# Patient Record
Sex: Female | Born: 1995 | Race: Black or African American | Hispanic: No | Marital: Single | Smoking: Never smoker
Health system: Southern US, Community
[De-identification: ages and names within clinical notes are randomized; demographics above are authoritative.]

## PROBLEM LIST (undated history)

## (undated) ENCOUNTER — Inpatient Hospital Stay (HOSPITAL_COMMUNITY): Payer: Self-pay

## (undated) ENCOUNTER — Inpatient Hospital Stay (HOSPITAL_COMMUNITY): Payer: No Typology Code available for payment source | Admitting: Obstetrics and Gynecology

## (undated) ENCOUNTER — Emergency Department: Admission: EM | Payer: No Typology Code available for payment source | Source: Home / Self Care

## (undated) ENCOUNTER — Emergency Department (HOSPITAL_COMMUNITY): Admission: EM | Payer: No Typology Code available for payment source

## (undated) DIAGNOSIS — A749 Chlamydial infection, unspecified: Secondary | ICD-10-CM

## (undated) HISTORY — PX: NO PAST SURGERIES: SHX2092

---
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
## 2013-02-19 ENCOUNTER — Encounter (HOSPITAL_BASED_OUTPATIENT_CLINIC_OR_DEPARTMENT_OTHER): Payer: Self-pay

## 2013-02-19 ENCOUNTER — Emergency Department (HOSPITAL_BASED_OUTPATIENT_CLINIC_OR_DEPARTMENT_OTHER)
Admission: EM | Admit: 2013-02-19 | Discharge: 2013-02-19 | Disposition: A | Discharge status: Home / Self Care | Payer: Self-pay | Attending: Emergency Medicine | Admitting: Emergency Medicine

## 2013-02-19 DIAGNOSIS — S0990XA Unspecified injury of head, initial encounter: Secondary | ICD-10-CM | POA: Insufficient documentation

## 2013-02-19 DIAGNOSIS — Y9351 Activity, roller skating (inline) and skateboarding: Secondary | ICD-10-CM | POA: Insufficient documentation

## 2013-02-19 DIAGNOSIS — Y92009 Unspecified place in unspecified non-institutional (private) residence as the place of occurrence of the external cause: Secondary | ICD-10-CM | POA: Insufficient documentation

## 2013-02-19 DIAGNOSIS — IMO0002 Reserved for concepts with insufficient information to code with codable children: Secondary | ICD-10-CM | POA: Insufficient documentation

## 2013-02-19 MED ORDER — HYDROCODONE-ACETAMINOPHEN 5-325 MG PO TABS: 1.0000 | ORAL_TABLET | Freq: Four times a day (QID) | ORAL | Status: DC | PRN

## 2013-02-19 MED ORDER — HYDROCODONE-ACETAMINOPHEN 5-325 MG PO TABS
2.0000 | ORAL_TABLET | Freq: Once | ORAL | Status: AC
  Administered 2013-02-19: 2 via ORAL
  Filled 2013-02-19: qty 2

## 2013-02-19 NOTE — ED Notes (Signed)
Patient reports that she was playing and fell hitting head on stone wall. No loc. Complains of feeling dizzy and tired. Alert and oiented

## 2013-02-19 NOTE — ED Notes (Signed)
MD at bedside. 

## 2013-02-19 NOTE — ED Provider Notes (Signed)
TIME SEEN: 9:30 PM  CHIEF COMPLAINT: Head injury  HPI: Patient is a 17 year old female with no significant past medical history presents the emergency department after she hit her head on a stone wall at her house approximately 2 hours ago. Patient reports that she was playing with her sister and turgor head quickly and hit her left side of her head against a wall. There was no loss of consciousness. She states she's had some dizziness that has resolved. No numbness, tingling or focal weakness. No vision or hearing changes.  ROS: See HPI Constitutional: no fever  Eyes: no drainage  ENT: no runny nose   Cardiovascular:  no chest pain  Resp: no SOB  GI: no vomiting GU: no dysuria Integumentary: no rash  Allergy: no hives  Musculoskeletal: no leg swelling  Neurological: no slurred speech ROS otherwise negative  PAST MEDICAL HISTORY/PAST SURGICAL HISTORY:  History reviewed. No pertinent past medical history.  MEDICATIONS:  Prior to Admission medications   Not on File    ALLERGIES:  No Known Allergies  SOCIAL HISTORY:  History  Substance Use Topics  . Smoking status: Never Smoker   . Smokeless tobacco: Not on file  . Alcohol Use: No    FAMILY HISTORY: Family History  Problem Relation Age of Onset  . Cancer Other   . Asthma Other     EXAM: BP 120/74  Pulse 74  Temp(Src) 98.3 F (36.8 C) (Oral)  Resp 18  SpO2 100%  LMP 02/05/2013 CONSTITUTIONAL: Alert and oriented and responds appropriately to questions. Well-appearing; well-nourished; GCS 15 HEAD: Normocephalic; atraumatic EYES: Conjunctivae clear, PERRL, EOMI ENT: normal nose; no rhinorrhea; moist mucous membranes; pharynx without lesions noted; no dental injury; no hemotypanum; no septal hematoma, mild tennis to palpation over the left side of her face and scalp with no bony deformity, swelling or ecchymosis NECK: Supple, no meningismus, no LAD; no midline spinal tenderness, step-off or deformity CARD: RRR; S1  and S2 appreciated; no murmurs, no clicks, no rubs, no gallops RESP: Normal chest excursion without splinting or tachypnea; breath sounds clear and equal bilaterally; no wheezes, no rhonchi, no rales; chest wall stable, nontender to palpation ABD/GI: Normal bowel sounds; non-distended; soft, non-tender, no rebound, no guarding PELVIS:  stable, nontender to palpation BACK:  The back appears normal and is non-tender to palpation, there is no CVA tenderness; no midline spinal tenderness, step-off or deformity EXT: Normal ROM in all joints; non-tender to palpation; no edema; normal capillary refill; no cyanosis    SKIN: Normal color for age and race; warm NEURO: Moves all extremities equally strength 5/5 in all 4 extremities, cranial nerves II through XII grossly intact, sensation to light touch intact diffusely, normal gait, no dysmetria finger-nose testing PSYCH: The patient's mood and manner are appropriate. Grooming and personal hygiene are appropriate.  MEDICAL DECISION MAKING: Patient with head injury who is neurologically intact. Discussed with patient that the likelihood of intracranial hemorrhage or fracture is very low. She agrees that she does not want a CT scan of her head at this time. She would like pain medication and discharge home. Discussed at length return precautions. Patient verbalizes understanding is comfortable with this plan.     Layla Maw Ward, DO 02/19/13 2156

## 2013-02-20 NOTE — ED Notes (Signed)
D/c home with ride- ice pack given for home use- rx x 1 for hydrocodone

## 2013-05-29 ENCOUNTER — Encounter (HOSPITAL_COMMUNITY): Payer: Self-pay | Admitting: Emergency Medicine

## 2013-05-29 ENCOUNTER — Emergency Department (INDEPENDENT_AMBULATORY_CARE_PROVIDER_SITE_OTHER)
Admission: EM | Admit: 2013-05-29 | Discharge: 2013-05-29 | Disposition: A | Discharge status: Home / Self Care | Payer: Self-pay | Source: Home / Self Care | Attending: Family Medicine | Admitting: Family Medicine

## 2013-05-29 DIAGNOSIS — L03019 Cellulitis of unspecified finger: Secondary | ICD-10-CM

## 2013-05-29 DIAGNOSIS — IMO0001 Reserved for inherently not codable concepts without codable children: Secondary | ICD-10-CM

## 2013-05-29 MED ORDER — MUPIROCIN CALCIUM 2 % EX CREA: 1.0000 "application " | TOPICAL_CREAM | Freq: Two times a day (BID) | CUTANEOUS | Status: DC

## 2013-05-29 NOTE — ED Notes (Signed)
C/o left middle finger swelling Denies injury but states that she did have a hang nail which could have got infected.

## 2013-05-29 NOTE — ED Provider Notes (Signed)
CSN: [id number]     Arrival date & time 05/29/13  1409 History   First MD Initiated Contact with Patient 05/29/13 1428     Chief Complaint  Patient presents with  . Hand Pain   (Consider location/radiation/quality/duration/timing/severity/associated sxs/prior Treatment) Patient is a 17 y.o. female presenting with hand pain. The history is provided by the patient.  Hand Pain This is a new problem. The current episode started more than 2 days ago. The problem has been gradually worsening.    History reviewed. No pertinent past medical history. History reviewed. No pertinent past surgical history. Family History  Problem Relation Age of Onset  . Cancer Other   . Asthma Other    History  Substance Use Topics  . Smoking status: Never Smoker   . Smokeless tobacco: Not on file  . Alcohol Use: No   OB History   Grav Para Term Preterm Abortions TAB SAB Ect Mult Living                 Review of Systems  Constitutional: Negative.   Musculoskeletal: Negative for joint swelling.  Skin: Positive for wound.    Allergies  Review of patient's allergies indicates no known allergies.  Home Medications   Current Outpatient Rx  Name  Route  Sig  Dispense  Refill  . HYDROcodone-acetaminophen (NORCO/VICODIN) 5-325 MG per tablet   Oral   Take 1 tablet by mouth every 6 (six) hours as needed for pain.   10 tablet   0   . mupirocin cream (BACTROBAN) 2 %   Topical   Apply 1 application topically 2 (two) times daily.   30 g   0    BP 128/75  Pulse 87  Temp(Src) 98.5 F (36.9 C) (Oral)  Resp 16  SpO2 97%  LMP 03/22/2013 Physical Exam  Nursing note and vitals reviewed. Constitutional: She is oriented to person, place, and time. She appears well-developed and well-nourished.  Musculoskeletal: She exhibits tenderness.  Paronychia of lmf, purulent fluid expressed, cultured.  Neurological: She is alert and oriented to person, place, and time.  Skin: Skin is warm and dry.    ED  Course  Procedures (including critical care time) Labs Review Labs Reviewed  WOUND CULTURE   Imaging Review No results found.  EKG Interpretation    Date/Time:    Ventricular Rate:    PR Interval:    QRS Duration:   QT Interval:    QTC Calculation:   R Axis:     Text Interpretation:              MDM     Linna Hoff, MD 05/29/13 1443

## 2013-05-31 LAB — POCT URINALYSIS DIP (DEVICE)
Glucose, UA: NEGATIVE mg/dL
Hgb urine dipstick: NEGATIVE
Nitrite: POSITIVE — AB
Urobilinogen, UA: 0.2 mg/dL (ref 0.0–1.0)
pH: 7 (ref 5.0–8.0)

## 2013-06-01 LAB — WOUND CULTURE

## 2013-06-30 ENCOUNTER — Encounter (HOSPITAL_COMMUNITY): Payer: Self-pay | Admitting: Emergency Medicine

## 2013-06-30 ENCOUNTER — Emergency Department (HOSPITAL_COMMUNITY)
Admission: EM | Admit: 2013-06-30 | Discharge: 2013-06-30 | Discharge status: Against Medical Advice | Payer: Self-pay | Attending: Emergency Medicine | Admitting: Emergency Medicine

## 2013-06-30 DIAGNOSIS — N946 Dysmenorrhea, unspecified: Secondary | ICD-10-CM | POA: Insufficient documentation

## 2013-06-30 LAB — CBC WITH DIFFERENTIAL/PLATELET
BASOS PCT: 0 % (ref 0–1)
Basophils Absolute: 0 10*3/uL (ref 0.0–0.1)
Eosinophils Absolute: 0 10*3/uL (ref 0.0–0.7)
Eosinophils Relative: 0 % (ref 0–5)
HEMATOCRIT: 39.6 % (ref 36.0–46.0)
HEMOGLOBIN: 13.5 g/dL (ref 12.0–15.0)
LYMPHS ABS: 2 10*3/uL (ref 0.7–4.0)
LYMPHS PCT: 31 % (ref 12–46)
MCH: 30.8 pg (ref 26.0–34.0)
MCHC: 34.1 g/dL (ref 30.0–36.0)
MCV: 90.2 fL (ref 78.0–100.0)
MONO ABS: 0.3 10*3/uL (ref 0.1–1.0)
MONOS PCT: 5 % (ref 3–12)
NEUTROS ABS: 4 10*3/uL (ref 1.7–7.7)
NEUTROS PCT: 64 % (ref 43–77)
Platelets: 254 10*3/uL (ref 150–400)
RBC: 4.39 MIL/uL (ref 3.87–5.11)
RDW: 12 % (ref 11.5–15.5)
WBC: 6.3 10*3/uL (ref 4.0–10.5)

## 2013-06-30 LAB — COMPREHENSIVE METABOLIC PANEL
ALBUMIN: 4.1 g/dL (ref 3.5–5.2)
ALK PHOS: 62 U/L (ref 39–117)
ALT: 8 U/L (ref 0–35)
AST: 19 U/L (ref 0–37)
BILIRUBIN TOTAL: 0.2 mg/dL — AB (ref 0.3–1.2)
BUN: 11 mg/dL (ref 6–23)
CHLORIDE: 102 meq/L (ref 96–112)
CO2: 25 meq/L (ref 19–32)
Calcium: 9.2 mg/dL (ref 8.4–10.5)
Creatinine, Ser: 0.66 mg/dL (ref 0.50–1.10)
GFR calc Af Amer: >90 mL/min (ref >90)
GLUCOSE: 112 mg/dL — AB (ref 70–99)
POTASSIUM: 4 meq/L (ref 3.7–5.3)
Sodium: 140 mEq/L (ref 137–147)
Total Protein: 7.7 g/dL (ref 6.0–8.3)

## 2013-06-30 LAB — URINE MICROSCOPIC-ADD ON

## 2013-06-30 LAB — URINALYSIS, ROUTINE W REFLEX MICROSCOPIC
Bilirubin Urine: NEGATIVE
GLUCOSE, UA: NEGATIVE mg/dL
Ketones, ur: NEGATIVE mg/dL
NITRITE: POSITIVE — AB
PH: 6.5 (ref 5.0–8.0)
Protein, ur: 30 mg/dL — AB
SPECIFIC GRAVITY, URINE: 1.028 (ref 1.005–1.030)
Urobilinogen, UA: 1 mg/dL (ref 0.0–1.0)

## 2013-06-30 LAB — POCT PREGNANCY, URINE: PREG TEST UR: NEGATIVE

## 2013-06-30 NOTE — ED Notes (Signed)
Pt. reports low abdominal menstrual cramping onset yesterday , denies nausea or vomitting , no fever or diarrhea.

## 2013-07-02 ENCOUNTER — Telehealth (HOSPITAL_COMMUNITY): Payer: Self-pay | Admitting: Emergency Medicine

## 2013-07-02 LAB — URINE CULTURE: Colony Count: >=100000

## 2013-07-02 NOTE — ED Notes (Signed)
Post ED Visit - Positive Culture Follow-up: Successful Patient Follow-Up  Culture assessed and recommendations reviewed by: []  Wes Dulaney, Pharm.D., BCPS []  Celedonio MiyamotoJeremy Frens, [address] BCPS []  Georgina PillionElizabeth Martin, Pharm.D., BCPS []  RowlettMinh Pham, [address]Pharm[address]D., BCPS, AAHIVP []  Estella HuskMichelle Turner, Pharm.D., BCPS, AAHIVP [x]  Harland GermanAndrew Meyer, Pharm.D., BCPS  Positive urine culture  [x]  Patient discharged without antimicrobial prescription and treatment is now indicated []  Organism is resistant to prescribed ED discharge antimicrobial []  Patient with positive blood cultures  Changes discussed with ED provider: Trixie DredgeEmily West PA-C New antibiotic prescription: Cipro 250 mg PO BID x 3 days    Zeb ComfortHolland, Olamide Lahaie 07/02/2013, 1:06 PM

## 2013-07-02 NOTE — Progress Notes (Signed)
ED Antimicrobial Stewardship Positive Culture Follow Up   Anita Johnson is an 18 y.o. female who [facility] with a chief complaint of  Chief Complaint  Patient presents with  . Abdominal Cramping    Recent Results (from the past 720 hour(s))  URINE CULTURE     Status: None   Collection Time    06/30/13  8:02 PM      Result Value Range Status   Specimen Description URINE, CLEAN CATCH   Final   Special Requests NONE   Final   Culture  Setup Time     Final   Value: 06/30/2013 21:32     [facility]formed at [facility]   Colony Count     Final   Value: >=100,000 COLONIES/ML     [facility]   Culture     Final   Value: ESCHERICHIA COLI     [facility] at [facility]   Report Status 07/02/2013 FINAL   Final   Organism ID, Bacteria ESCHERICHIA COLI   Final    [x]  Patient discharged originally without antimicrobial agent and treatment is now indicated  New antibiotic prescription: Cipro 250mg  po q12h x 3days  ED Provider: [facility]redgemily West [facility]  Harland GermanAndrew Raeley Gilmore, Pharm D 07/02/2013 11:56 AM  Clinical Pharmacist  Phone# [phone]

## 2013-07-06 ENCOUNTER — Emergency Department (INDEPENDENT_AMBULATORY_CARE_PROVIDER_SITE_OTHER)
Admission: EM | Admit: 2013-07-06 | Discharge: 2013-07-06 | Disposition: A | Discharge status: Home / Self Care | Payer: Self-pay | Source: Home / Self Care | Attending: Family Medicine | Admitting: Family Medicine

## 2013-07-06 ENCOUNTER — Encounter (HOSPITAL_COMMUNITY): Payer: Self-pay | Admitting: Emergency Medicine

## 2013-07-06 DIAGNOSIS — S239XXA Sprain of unspecified parts of thorax, initial encounter: Secondary | ICD-10-CM

## 2013-07-06 MED ORDER — IBUPROFEN 800 MG PO TABS: 800.0000 mg | ORAL_TABLET | Freq: Three times a day (TID) | ORAL | Status: DC

## 2013-07-06 MED ORDER — CYCLOBENZAPRINE HCL 5 MG PO TABS: 5.0000 mg | ORAL_TABLET | Freq: Three times a day (TID) | ORAL | Status: DC | PRN

## 2013-07-06 NOTE — ED Notes (Signed)
Pt  Reports       Symptoms      Of         Low  Back  Pain      X  2  Days        The pt       denys  Any  specefic  Injury  She  Ambulates  With a  Steady    Fluid  Gait                denys  Any  Urinary       Symptoms           Sitting  Upright  On       Exam   Table  In no  disteress

## 2013-07-06 NOTE — ED Provider Notes (Signed)
CSN: [id number]     Arrival date & time 07/06/13  1820 History   First MD Initiated Contact with Patient 07/06/13 1840     Chief Complaint  Patient presents with  . Back Pain   (Consider location/radiation/quality/duration/timing/severity/associated sxs/prior Treatment) Patient is a 18 y.o. female presenting with back pain. The history is provided by the patient and a parent.  Back Pain Location:  Thoracic spine Quality:  Shooting Radiates to:  Does not radiate Pain severity:  Mild Onset quality:  Gradual Duration:  2 days Progression:  Waxing and waning Chronicity:  New Context: lifting heavy objects   Associated symptoms: no abdominal pain, no chest pain, no fever and no numbness     History reviewed. No pertinent past medical history. History reviewed. No pertinent past surgical history. Family History  Problem Relation Age of Onset  . Cancer Other   . Asthma Other    History  Substance Use Topics  . Smoking status: Never Smoker   . Smokeless tobacco: Not on file  . Alcohol Use: No   OB History   Grav Para Term Preterm Abortions TAB SAB Ect Mult Living                 Review of Systems  Constitutional: Negative.  Negative for fever.  Respiratory: Negative for chest tightness.   Cardiovascular: Negative for chest pain.  Gastrointestinal: Negative.  Negative for abdominal pain.  Musculoskeletal: Positive for back pain. Negative for gait problem.  Skin: Negative.   Neurological: Negative for numbness.    Allergies  Review of patient's allergies indicates no known allergies.  Home Medications   Current Outpatient Rx  Name  Route  Sig  Dispense  Refill  . cyclobenzaprine (FLEXERIL) 5 MG tablet   Oral   Take 1 tablet (5 mg total) by mouth 3 (three) times daily as needed for muscle spasms.   30 tablet   0   . HYDROcodone-acetaminophen (NORCO/VICODIN) 5-325 MG per tablet   Oral   Take 1 tablet by mouth every 6 (six) hours as needed for pain.   10 tablet  0   . ibuprofen (ADVIL,MOTRIN) 800 MG tablet   Oral   Take 1 tablet (800 mg total) by mouth 3 (three) times daily.   30 tablet   0   . mupirocin cream (BACTROBAN) 2 %   Topical   Apply 1 application topically 2 (two) times daily.   30 g   0    BP 120/73  Pulse 64  Temp(Src) 97.9 F (36.6 C) (Oral)  Resp 18  SpO2 100%  LMP 07/03/2013 Physical Exam  Nursing note and vitals reviewed. Constitutional: She is oriented to person, place, and time. She appears well-developed and well-nourished. No distress.  Neck: Normal range of motion. Neck supple.  Cardiovascular: Normal heart sounds and intact distal pulses.   Pulmonary/Chest: Effort normal and breath sounds normal. She exhibits no tenderness.  Abdominal: Soft. Bowel sounds are normal. There is no tenderness.  Musculoskeletal: She exhibits tenderness.       Back:  Neurological: She is alert and oriented to person, place, and time.  Skin: Skin is warm and dry.    ED Course  Procedures (including critical care time) Labs Review Labs Reviewed - No data to display Imaging Review No results found.  EKG Interpretation    Date/Time:    Ventricular Rate:    PR Interval:    QRS Duration:   QT Interval:    QTC Calculation:  R Axis:     Text Interpretation:              MDM      Linna HoffJames D Jenniffer Vessels, MD 07/06/13 2102

## 2013-07-19 ENCOUNTER — Encounter (HOSPITAL_COMMUNITY): Payer: Self-pay | Admitting: Emergency Medicine

## 2013-07-19 ENCOUNTER — Emergency Department (HOSPITAL_COMMUNITY)
Admission: EM | Admit: 2013-07-19 | Discharge: 2013-07-19 | Discharge status: Against Medical Advice | Payer: Self-pay | Attending: Emergency Medicine | Admitting: Emergency Medicine

## 2013-07-19 DIAGNOSIS — R059 Cough, unspecified: Secondary | ICD-10-CM | POA: Insufficient documentation

## 2013-07-19 DIAGNOSIS — R51 Headache: Secondary | ICD-10-CM | POA: Insufficient documentation

## 2013-07-19 DIAGNOSIS — R05 Cough: Secondary | ICD-10-CM | POA: Insufficient documentation

## 2013-07-19 NOTE — ED Notes (Signed)
C/o frontal headache, dizziness, and non-productive cough since yesterday.

## 2013-07-19 NOTE — ED Notes (Signed)
No answer when called 

## 2013-07-19 NOTE — ED Notes (Signed)
Patient called and no answer. 

## 2014-03-16 ENCOUNTER — Emergency Department (HOSPITAL_COMMUNITY)
Admission: EM | Admit: 2014-03-16 | Discharge: 2014-03-17 | Disposition: A | Discharge status: Home / Self Care | Payer: No Typology Code available for payment source | Attending: Emergency Medicine | Admitting: Emergency Medicine

## 2014-03-16 ENCOUNTER — Encounter (HOSPITAL_COMMUNITY): Payer: Self-pay | Admitting: Emergency Medicine

## 2014-03-16 DIAGNOSIS — Y9389 Activity, other specified: Secondary | ICD-10-CM | POA: Insufficient documentation

## 2014-03-16 DIAGNOSIS — S99919A Unspecified injury of unspecified ankle, initial encounter: Principal | ICD-10-CM

## 2014-03-16 DIAGNOSIS — Y92410 Unspecified street and highway as the place of occurrence of the external cause: Secondary | ICD-10-CM | POA: Insufficient documentation

## 2014-03-16 DIAGNOSIS — S199XXA Unspecified injury of neck, initial encounter: Secondary | ICD-10-CM

## 2014-03-16 DIAGNOSIS — S8990XA Unspecified injury of unspecified lower leg, initial encounter: Secondary | ICD-10-CM | POA: Insufficient documentation

## 2014-03-16 DIAGNOSIS — M25561 Pain in right knee: Secondary | ICD-10-CM

## 2014-03-16 DIAGNOSIS — R519 Headache, unspecified: Secondary | ICD-10-CM

## 2014-03-16 DIAGNOSIS — R51 Headache: Secondary | ICD-10-CM

## 2014-03-16 DIAGNOSIS — S0993XA Unspecified injury of face, initial encounter: Secondary | ICD-10-CM | POA: Insufficient documentation

## 2014-03-16 DIAGNOSIS — S99929A Unspecified injury of unspecified foot, initial encounter: Principal | ICD-10-CM
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
## 2014-03-17 ENCOUNTER — Emergency Department (HOSPITAL_COMMUNITY): Payer: No Typology Code available for payment source

## 2014-03-17 MED ORDER — OXYCODONE-ACETAMINOPHEN 5-325 MG PO TABS
1.0000 | ORAL_TABLET | Freq: Four times a day (QID) | ORAL | Status: DC | PRN
Start: 2014-03-17 — End: 2014-05-23

## 2014-03-17 MED ORDER — OXYCODONE-ACETAMINOPHEN 5-325 MG PO TABS
2.0000 | ORAL_TABLET | Freq: Once | ORAL | Status: AC
  Administered 2014-03-17: 1 via ORAL
  Filled 2014-03-17: qty 2

## 2014-03-17 MED ORDER — NAPROXEN 500 MG PO TABS: 500.0000 mg | ORAL_TABLET | Freq: Two times a day (BID) | ORAL | Status: DC

## 2014-03-17 NOTE — Discharge Instructions (Signed)
Your x-ray is negative for fracture or dislocation of your right knee. Recommend you keep your knee in a knee sleeve for stability and comfort. Take naproxen as prescribed for pain. You may take Percocet as needed for severe pain control. Apply ice to your knee 3-4 times per day. Use crutches as needed when walking. Follow up with orthopedics if symptoms persist despite recommended treatment after 1 to 2 weeks.  Arthralgia Your caregiver has diagnosed you as suffering from an arthralgia. Arthralgia means there is pain in a joint. This can come from many reasons including:  Bruising the joint which causes soreness (inflammation) in the joint.  Wear and tear on the joints which occur as we grow older (osteoarthritis).  Overusing the joint.  Various forms of arthritis.  Infections of the joint. Regardless of the cause of pain in your joint, most of these different pains respond to anti-inflammatory drugs and rest. The exception to this is when a joint is infected, and these cases are treated with antibiotics, if it is a bacterial infection. HOME CARE INSTRUCTIONS   Rest the injured area for as long as directed by your caregiver. Then slowly start using the joint as directed by your caregiver and as the pain allows. Crutches as directed may be useful if the ankles, knees or hips are involved. If the knee was splinted or casted, continue use and care as directed. If an stretchy or elastic wrapping bandage has been applied today, it should be removed and re-applied every 3 to 4 hours. It should not be applied tightly, but firmly enough to keep swelling down. Watch toes and feet for swelling, bluish discoloration, coldness, numbness or excessive pain. If any of these problems (symptoms) occur, remove the ace bandage and re-apply more loosely. If these symptoms persist, contact your caregiver or return to this location.  For the first 24 hours, keep the injured extremity elevated on pillows while lying  down.  Apply ice for 15-20 minutes to the sore joint every couple hours while awake for the first half day. Then 03-04 times per day for the first 48 hours. Put the ice in a plastic bag and place a towel between the bag of ice and your skin.  Wear any splinting, casting, elastic bandage applications, or slings as instructed.  Only take over-the-counter or prescription medicines for pain, discomfort, or fever as directed by your caregiver. Do not use aspirin immediately after the injury unless instructed by your physician. Aspirin can cause increased bleeding and bruising of the tissues.  If you were given crutches, continue to use them as instructed and do not resume weight bearing on the sore joint until instructed. Persistent pain and inability to use the sore joint as directed for more than 2 to 3 days are warning signs indicating that you should see a caregiver for a follow-up visit as soon as possible. Initially, a hairline fracture (break in bone) may not be evident on X-rays. Persistent pain and swelling indicate that further evaluation, non-weight bearing or use of the joint (use of crutches or slings as instructed), or further X-rays are indicated. X-rays may sometimes not show a small fracture until a week or 10 days later. Make a follow-up appointment with your own caregiver or one to whom we have referred you. A radiologist (specialist in reading X-rays) may read your X-rays. Make sure you know how you are to obtain your X-ray results. Do not assume everything is normal if you do not hear from [place]. SEEK  MEDICAL CARE IF: Bruising, swelling, or pain increases. SEEK IMMEDIATE MEDICAL CARE IF:   Your fingers or toes are numb or blue.  The pain is not responding to medications and continues to stay the same or get worse.  The pain in your joint becomes severe.  You develop a fever over 102 F (38.9 C).  It becomes impossible to move or use the joint. MAKE SURE YOU:   Understand these  instructions.  Will watch your condition.  Will get help right away if you are not doing well or get worse. Document Released: 06/08/2005 Document Revised: 08/31/2011 Document Reviewed: 01/25/2008 [facility] Patient Information 2015 [place], [place]. This information is not intended to replace advice given to you by your health care provider. Make sure you discuss any questions you have with your health care provider. RICE: Routine Care for Injuries The routine care of many injuries includes Rest, Ice, Compression, and Elevation (RICE). HOME CARE INSTRUCTIONS  Rest is needed to allow your body to heal. Routine activities can usually be resumed when comfortable. Injured tendons and bones can take up to 6 weeks to heal. Tendons are the cord-like structures that attach muscle to bone.  Ice following an injury helps keep the swelling down and reduces pain.  Put ice in a plastic bag.  Place a towel between your skin and the bag.  Leave the ice on for 15-20 minutes, 3-4 times a day, or as directed by your health care provider. Do this while awake, for the first 24 to 48 hours. After that, continue as directed by your caregiver.  Compression helps keep swelling down. It also gives support and helps with discomfort. If an elastic bandage has been applied, it should be removed and reapplied every 3 to 4 hours. It should not be applied tightly, but firmly enough to keep swelling down. Watch fingers or toes for swelling, bluish discoloration, coldness, numbness, or excessive pain. If any of these problems occur, remove the bandage and reapply loosely. Contact your caregiver if these problems continue.  Elevation helps reduce swelling and decreases pain. With extremities, such as the arms, hands, legs, and feet, the injured area should be placed near or above the level of the heart, if possible. SEEK IMMEDIATE MEDICAL CARE IF:  You have persistent pain and swelling.  You develop redness, numbness, or  unexpected weakness.  Your symptoms are getting worse rather than improving after several days. These symptoms may indicate that further evaluation or further X-rays are needed. Sometimes, X-rays may not show a small broken bone (fracture) until 1 week or 10 days later. Make a follow-up appointment with your caregiver. Ask when your X-ray results will be ready. Make sure you get your X-ray results. Document Released: 09/20/2000 Document Revised: 06/13/2013 Document Reviewed: 11/07/2010 [facility] Patient Information 2015 [place], [place]. This information is not intended to replace advice given to you by your health care provider. Make sure you discuss any questions you have with your health care provider.

## 2014-03-17 NOTE — ED Provider Notes (Signed)
Medical screening examination/treatment/procedure(s) were performed by non-physician practitioner and as supervising physician I was immediately available for consultation/collaboration.   EKG Interpretation None       Brendt Dible M Deaven Urwin, MD 03/17/14 0243 

## 2014-04-24 ENCOUNTER — Emergency Department (HOSPITAL_COMMUNITY)
Admission: EM | Admit: 2014-04-24 | Discharge: 2014-04-24 | Disposition: A | Discharge status: Home / Self Care | Payer: Medicaid Other | Attending: Emergency Medicine | Admitting: Emergency Medicine

## 2014-04-24 ENCOUNTER — Encounter (HOSPITAL_COMMUNITY): Payer: Self-pay | Admitting: Physical Medicine and Rehabilitation

## 2014-04-24 DIAGNOSIS — O2391 Unspecified genitourinary tract infection in pregnancy, first trimester: Secondary | ICD-10-CM | POA: Diagnosis not present

## 2014-04-24 DIAGNOSIS — N760 Acute vaginitis: Secondary | ICD-10-CM

## 2014-04-24 DIAGNOSIS — R1030 Lower abdominal pain, unspecified: Secondary | ICD-10-CM | POA: Insufficient documentation

## 2014-04-24 DIAGNOSIS — B9689 Other specified bacterial agents as the cause of diseases classified elsewhere: Secondary | ICD-10-CM

## 2014-04-24 DIAGNOSIS — O99891 Other specified diseases and conditions complicating pregnancy: Secondary | ICD-10-CM

## 2014-04-24 DIAGNOSIS — O9989 Other specified diseases and conditions complicating pregnancy, childbirth and the puerperium: Secondary | ICD-10-CM | POA: Insufficient documentation

## 2014-04-24 DIAGNOSIS — R1032 Left lower quadrant pain: Secondary | ICD-10-CM

## 2014-04-24 DIAGNOSIS — Z3A01 Less than 8 weeks gestation of pregnancy: Secondary | ICD-10-CM | POA: Insufficient documentation

## 2014-04-24 DIAGNOSIS — Z3401 Encounter for supervision of normal first pregnancy, first trimester: Secondary | ICD-10-CM

## 2014-04-24 DIAGNOSIS — R8271 Bacteriuria: Secondary | ICD-10-CM

## 2014-04-24 DIAGNOSIS — R1031 Right lower quadrant pain: Secondary | ICD-10-CM

## 2014-04-24 LAB — WET PREP, GENITAL
Trich, Wet Prep: NONE SEEN
Yeast Wet Prep HPF POC: NONE SEEN

## 2014-04-24 LAB — URINALYSIS, ROUTINE W REFLEX MICROSCOPIC
BILIRUBIN URINE: NEGATIVE
Glucose, UA: NEGATIVE mg/dL
HGB URINE DIPSTICK: NEGATIVE
Ketones, ur: NEGATIVE mg/dL
Nitrite: NEGATIVE
PH: 8 (ref 5.0–8.0)
Protein, ur: NEGATIVE mg/dL
SPECIFIC GRAVITY, URINE: 1.021 (ref 1.005–1.030)
Urobilinogen, UA: 0.2 mg/dL (ref 0.0–1.0)

## 2014-04-24 LAB — POC URINE PREG, ED: Preg Test, Ur: POSITIVE — AB

## 2014-04-24 LAB — HIV ANTIBODY (ROUTINE TESTING W REFLEX): HIV 1&2 Ab, 4th Generation: NONREACTIVE

## 2014-04-24 LAB — URINE MICROSCOPIC-ADD ON

## 2014-04-24 LAB — RPR

## 2014-04-24 MED ORDER — METRONIDAZOLE 500 MG PO TABS: 500.0000 mg | ORAL_TABLET | Freq: Two times a day (BID) | ORAL | Status: DC

## 2014-04-24 MED ORDER — NITROFURANTOIN MONOHYD MACRO 100 MG PO CAPS: 100.0000 mg | ORAL_CAPSULE | Freq: Two times a day (BID) | ORAL | Status: DC

## 2014-04-24 NOTE — Discharge Instructions (Signed)
°Abdominal Pain During Pregnancy °Abdominal pain is common in pregnancy. Most of the time, it does not cause harm. There are many causes of abdominal pain. Some causes are more serious than others. Some of the causes of abdominal pain in pregnancy are easily diagnosed. Occasionally, the diagnosis takes time to understand. Other times, the cause is not determined. Abdominal pain can be a sign that something is very wrong with the pregnancy, or the pain may have nothing to do with the pregnancy at all. For this reason, always tell your health care provider if you have any abdominal discomfort. °HOME CARE INSTRUCTIONS  °Monitor your abdominal pain for any changes. The following actions may help to alleviate any discomfort you are experiencing: °· Do not have sexual intercourse or put anything in your vagina until your symptoms go away completely. °· Get plenty of rest until your pain improves. °· Drink clear fluids if you feel nauseous. Avoid solid food as long as you are uncomfortable or nauseous. °· Only take over-the-counter or prescription medicine as directed by your health care provider. °· Keep all follow-up appointments with your health care provider. °SEEK IMMEDIATE MEDICAL CARE IF: °· You are bleeding, leaking fluid, or passing tissue from the vagina. °· You have increasing pain or cramping. °· You have persistent vomiting. °· You have painful or bloody urination. °· You have a fever. °· You notice a decrease in your baby's movements. °· You have extreme weakness or feel faint. °· You have shortness of breath, with or without abdominal pain. °· You develop a severe headache with abdominal pain. °· You have abnormal vaginal discharge with abdominal pain. °· You have persistent diarrhea. °· You have abdominal pain that continues even after rest, or gets worse. °MAKE SURE YOU:  °· Understand these instructions. °· Will watch your condition. °· Will get help right away if you are not doing well or get  worse. °Document Released: 06/08/2005 Document Revised: 03/29/2013 Document Reviewed: 01/05/2013 °Exit[employer]® Patient Information ©2015 [employer]. This information is not intended to replace advice given to you by your health care provider. Make sure you discuss any questions you have with your health care provider. °Bacterial Vaginosis °Bacterial vaginosis is a vaginal infection that occurs when the normal balance of bacteria in the vagina is disrupted. It results from an overgrowth of certain bacteria. This is the most common vaginal infection in women of childbearing age. Treatment is important to prevent complications, especially in pregnant women, as it can cause a premature delivery. °CAUSES  °Bacterial vaginosis is caused by an increase in harmful bacteria that are normally present in smaller amounts in the vagina. Several different kinds of bacteria can cause bacterial vaginosis. However, the reason that the condition develops is not fully understood. °RISK FACTORS °Certain activities or behaviors can put you at an increased risk of developing bacterial vaginosis, including: °· Having a new sex partner or multiple sex partners. °· Douching. °· Using an intrauterine device (IUD) for contraception. °Women do not get bacterial vaginosis from toilet seats, bedding, swimming pools, or contact with objects around them. °SIGNS AND SYMPTOMS  °Some women with bacterial vaginosis have no signs or symptoms. Common symptoms include: °· Grey vaginal discharge. °· A fishlike odor with discharge, especially after sexual intercourse. °· Itching or burning of the vagina and vulva. °· Burning or pain with urination. °DIAGNOSIS  °Your health care provider will take a medical history and examine the vagina for signs of bacterial vaginosis. A sample of vaginal fluid may   be taken. Your health care provider will look at this sample under a microscope to check for bacteria and abnormal cells. A vaginal pH test may also be done.   °TREATMENT  °Bacterial vaginosis may be treated with antibiotic medicines. These may be given in the form of a pill or a vaginal cream. A second round of antibiotics may be prescribed if the condition comes back after treatment.  °HOME CARE INSTRUCTIONS  °· Only take over-the-counter or prescription medicines as directed by your health care provider. °· If antibiotic medicine was prescribed, take it as directed. Make sure you finish it even if you start to feel better. °· Do not have sex until treatment is completed. °· Tell all sexual partners that you have a vaginal infection. They should see their health care provider and be treated if they have problems, such as a mild rash or itching. °· Practice safe sex by using condoms and only having one sex partner. °SEEK MEDICAL CARE IF:  °· Your symptoms are not improving after 3 days of treatment. °· You have increased discharge or pain. °· You have a fever. °MAKE SURE YOU:  °· Understand these instructions. °· Will watch your condition. °· Will get help right away if you are not doing well or get worse. °FOR MORE INFORMATION  °Centers for Disease Control and Prevention, Division of STD Prevention: www.cdc.gov/std °American Sexual Health Association (ASHA): www.ashastd.org  °Document Released: 06/08/2005 Document Revised: 03/29/2013 Document Reviewed: 01/18/2013 °ExitCare® Patient Information ©2015 [employer]. This information is not intended to replace advice given to you by your health care provider. Make sure you discuss any questions you have with your health care provider. ° °

## 2014-04-24 NOTE — ED Notes (Signed)
Pt states lower abdominal pain/cramping sensation. Also states thick white vaginal discharge. States positive home pregnancy test last week. 7/10 pain at present. Denies urinary symptoms.

## 2014-04-24 NOTE — ED Notes (Addendum)
Pt presents to department for evaluation of lower abdominal pain. Also reports white colored vaginal discharge and positive home pregnancy test. 7/10 pain upon arrival. Pt is alert and oriented x4.

## 2014-04-24 NOTE — ED Notes (Signed)
MD at bedside with US.

## 2014-04-24 NOTE — ED Provider Notes (Signed)
CSN: [id number]     Arrival date & time 04/24/14  0935 History [id number]  First MD Initiated Contact with Patient 04/24/14 [phone]     Chief Complaint  Patient presents with  . Abdominal Pain    HPI Comments: Pt presents with lower abdominal pain and vaginal discharge that have been present for the last two weeks.  Reports pain is diffuse lower abdominal, nonradiating, worse at night and in the morning.  No worsening/alleviating factors.  Discharge is clumpy and white, no odor.  Pt reports positive home pregnancy test last week. LMP was mid September.  No prior pregnancies.  No abdominal surgeries.  Pt is sexually active.  Patient is a 18 y.o. female presenting with abdominal pain. The history is provided by the patient.  Abdominal Pain Pain location:  LLQ and RLQ Pain quality comment:  Hurting Pain radiates to:  Does not radiate Pain severity:  Moderate Onset quality:  Gradual Duration:  2 weeks Timing:  Intermittent Progression:  Waxing and waning Chronicity:  New Context: recent sexual activity   Context: not previous surgeries and not recent illness   Relieved by:  Nothing Worsened by:  Nothing tried Associated symptoms: vaginal discharge   Associated symptoms: no constipation, no diarrhea, no fever, no nausea, no vaginal bleeding and no vomiting     History reviewed. No pertinent past medical history. History reviewed. No pertinent past surgical history. Family History  Problem Relation Age of Onset  . Cancer Other   . Asthma Other    History  Substance Use Topics  . Smoking status: Never Smoker   . Smokeless tobacco: Not on file  . Alcohol Use: No   OB History    No data available     Review of Systems  Constitutional: Negative for fever.  Gastrointestinal: Positive for abdominal pain. Negative for nausea, vomiting, diarrhea and constipation.  Genitourinary: Positive for vaginal discharge. Negative for vaginal bleeding.  All other systems reviewed and are  negative.     Allergies  Review of patient's allergies indicates no known allergies.  Home Medications   Prior to Admission medications   Medication Sig Start Date End Date Taking? Authorizing Provider  naproxen (NAPROSYN) 500 MG tablet Take 1 tablet (500 mg total) by mouth 2 (two) times daily. 03/17/14   Antony MaduraKelly Humes, PA-C  oxyCODONE-acetaminophen (PERCOCET/ROXICET) 5-325 MG per tablet Take 1-2 tablets by mouth every 6 (six) hours as needed for moderate pain or severe pain. 03/17/14   Antony MaduraKelly Humes, PA-C   BP 122/67 mmHg  Temp(Src) 98 F (36.7 C) (Oral)  Resp 18  Ht 4\' 11"  (1.499 m)  Wt 147 lb (66.679 kg)  BMI 29.67 kg/m2  SpO2 100%  LMP 03/05/2014 Physical Exam  Constitutional: She is oriented to person, place, and time. She appears well-developed and well-nourished.  HENT:  Head: Normocephalic.  Cardiovascular: Normal rate, regular rhythm and normal heart sounds.   Pulmonary/Chest: Effort normal and breath sounds normal.  Abdominal: Soft.  Mild lower abdominal tenderness without guarding or rebound  Genitourinary:  Pelvic exam: normal external genitalia, vulva, vagina, cervix, and uterus. Nontender.  OS closed.  Moderate white discharge.    Musculoskeletal: She exhibits no edema or tenderness.  Neurological: She is alert and oriented to person, place, and time.  Skin: Skin is warm and dry.  Psychiatric: She has a normal mood and affect.    ED Course  Procedures (including critical care time)  EMERGENCY DEPARTMENT US PREGNANCY "Study: Limited Ultrasound of the Pelvis"  INDICATIONS:Pregnancy(required)  Multiple views of the uterus and pelvic cavity are obtained with a multi-frequency probe.  APPROACH:Transabdominal   PERFORMED BY: Myself  IMAGES ARCHIVED?: Yes  LIMITATIONS: Decompressed bladder  PREGNANCY FREE FLUID: None  PREGNANCY UTERUS FINDINGS:Gestational sac noted ADNEXAL FINDINGS: unable to obtain  PREGNANCY FINDINGS: Fetal heart activity  seen  INTERPRETATION: Viable intrauterine pregnancy  GESTATIONAL AGE, ESTIMATE:   FETAL HEART RATE: 176-188  COMMENT(Estimate of Gestational Age):    Labs Review Labs Reviewed  WET PREP, GENITAL - Abnormal; Notable for the following:    Clue Cells Wet Prep HPF POC FEW (*)    WBC, Wet Prep HPF POC FEW (*)    All other components within normal limits  URINALYSIS, ROUTINE W REFLEX MICROSCOPIC - Abnormal; Notable for the following:    APPearance CLOUDY (*)    Leukocytes, UA SMALL (*)    All other components within normal limits  URINE MICROSCOPIC-ADD ON - Abnormal; Notable for the following:    Squamous Epithelial / LPF FEW (*)    Bacteria, UA MANY (*)    All other components within normal limits  POC URINE PREG, ED - Abnormal; Notable for the following:    Preg Test, Ur POSITIVE (*)    All other components within normal limits  GC/CHLAMYDIA PROBE AMP  RPR  HIV ANTIBODY (ROUTINE TESTING)    Imaging Review No results found.   EKG Interpretation None      MDM   Final diagnoses:  Abdominal pain, acute, bilateral lower quadrant  First pregnancy, first trimester  Bacteriuria during pregnancy in first trimester  Bacterial vaginosis    Pt here with lower abdominal pain, vaginal discharge.  No evidence of ectopic pregnancy based on bedside US.  Exam not c/w PID.  D/w pt treatment for BV and bacteriuria - importance of OB followup.  Clinical picture not c/w pyelonephritis, appendicitis, torsion.      Tilden FossaElizabeth Trenisha Lafavor, MD 04/24/14 1550

## 2014-04-25 LAB — GC/CHLAMYDIA PROBE AMP
CT Probe RNA: POSITIVE — AB
GC PROBE AMP APTIMA: NEGATIVE

## 2014-04-30 ENCOUNTER — Telehealth (HOSPITAL_COMMUNITY): Payer: Self-pay

## 2014-05-01 ENCOUNTER — Telehealth (HOSPITAL_COMMUNITY): Payer: Self-pay

## 2014-05-03 ENCOUNTER — Telehealth (HOSPITAL_COMMUNITY): Payer: Self-pay

## 2014-05-03 NOTE — ED Notes (Signed)
Per A. Harris PA Azithromycin 1gram po x 1 called to [facility] on [address]

## 2014-05-23 ENCOUNTER — Inpatient Hospital Stay (HOSPITAL_COMMUNITY)
Admission: AD | Admit: 2014-05-23 | Discharge: 2014-05-23 | Disposition: A | Discharge status: Home / Self Care | Payer: Self-pay | Source: Ambulatory Visit | Attending: Obstetrics & Gynecology | Admitting: Obstetrics & Gynecology

## 2014-05-23 ENCOUNTER — Encounter (HOSPITAL_COMMUNITY): Payer: Self-pay

## 2014-05-23 DIAGNOSIS — R109 Unspecified abdominal pain: Secondary | ICD-10-CM | POA: Insufficient documentation

## 2014-05-23 DIAGNOSIS — O26899 Other specified pregnancy related conditions, unspecified trimester: Secondary | ICD-10-CM

## 2014-05-23 DIAGNOSIS — Z3A11 11 weeks gestation of pregnancy: Secondary | ICD-10-CM | POA: Insufficient documentation

## 2014-05-23 DIAGNOSIS — O9989 Other specified diseases and conditions complicating pregnancy, childbirth and the puerperium: Secondary | ICD-10-CM | POA: Insufficient documentation

## 2014-05-23 HISTORY — DX: Chlamydial infection, unspecified: A74.9

## 2014-05-23 LAB — URINALYSIS, ROUTINE W REFLEX MICROSCOPIC
BILIRUBIN URINE: NEGATIVE
GLUCOSE, UA: NEGATIVE mg/dL
HGB URINE DIPSTICK: NEGATIVE
Ketones, ur: NEGATIVE mg/dL
Leukocytes, UA: NEGATIVE
Nitrite: NEGATIVE
PROTEIN: NEGATIVE mg/dL
Specific Gravity, Urine: 1.02 (ref 1.005–1.030)
Urobilinogen, UA: 0.2 mg/dL (ref 0.0–1.0)
pH: 7 (ref 5.0–8.0)

## 2014-05-23 NOTE — MAU Provider Note (Signed)
History     CSN: [id number]  Arrival date and time: 05/23/14 [id number]   First Provider Initiated Contact with Patient 05/23/14 1030      Chief Complaint  Patient presents with  . Abdominal Pain   HPI    [name] is a 18 y.o.female G1P0 at 207w2d who presents with abdominal pain. The pain started 1 week ago; the pain is constant. The pain is located on both sides of her lower stomach. She denies abnormal discharge or vaginal bleeding.  [facility] and had a positive culture for Chlamydia; she took the medication as prescribed. She did not inform her partner and she denies any further sexual contact with him. She has not started prenatal care. Her health insurance will start the first of the year and she plans to start care then. She is interested in having an ultrasound done.   OB History    Gravida Para Term Preterm AB TAB SAB Ectopic Multiple Living   1         0      Past Medical History  Diagnosis Date  . Chlamydia     History reviewed. No pertinent past surgical history.  Family History  Problem Relation Age of Onset  . Cancer Other   . Asthma Other     History  Substance Use Topics  . Smoking status: Never Smoker   . Smokeless tobacco: Not on file  . Alcohol Use: No    Allergies: No Known Allergies  Prescriptions prior to admission  Medication Sig Dispense Refill Last Dose  . metroNIDAZOLE (FLAGYL) 500 MG tablet Take 1 tablet (500 mg total) by mouth 2 (two) times daily. (Patient not taking: Reported on 05/23/2014) 14 tablet 0   . nitrofurantoin, macrocrystal-monohydrate, (MACROBID) 100 MG capsule Take 1 capsule (100 mg total) by mouth 2 (two) times daily. (Patient not taking: Reported on 05/23/2014) 10 capsule 0   . oxyCODONE-acetaminophen (PERCOCET/ROXICET) 5-325 MG per tablet Take 1-2 tablets by mouth every 6 (six) hours as needed for moderate pain or severe pain. (Patient not taking: Reported on 05/23/2014) 15 tablet 0     Results for orders placed or performed during the hospital encounter of 05/23/14 (from the past 48 hour(s))  Urinalysis, Routine w reflex microscopic     Status: None   Collection Time: 05/23/14 10:00 AM  Result Value Ref Range   Color, Urine YELLOW YELLOW   APPearance CLEAR CLEAR   Specific Gravity, Urine 1.020 1.005 - 1.030   pH 7.0 5.0 - 8.0   Glucose, UA NEGATIVE NEGATIVE mg/dL   Hgb urine dipstick NEGATIVE NEGATIVE   Bilirubin Urine NEGATIVE NEGATIVE   Ketones, ur NEGATIVE NEGATIVE mg/dL   Protein, ur NEGATIVE NEGATIVE mg/dL   Urobilinogen, UA 0.2 0.0 - 1.0 mg/dL   Nitrite NEGATIVE NEGATIVE   Leukocytes, UA NEGATIVE NEGATIVE    Comment: MICROSCOPIC NOT DONE ON URINES WITH NEGATIVE PROTEIN, BLOOD, LEUKOCYTES, NITRITE, OR GLUCOSE <1000 mg/dL.    Review of Systems  Constitutional: Negative for fever and chills.  Gastrointestinal: Positive for abdominal pain. Negative for nausea, vomiting, diarrhea and constipation.  Genitourinary: Negative for dysuria, urgency and frequency.  Musculoskeletal: Positive for back pain.   Physical Exam   Blood pressure 125/71, pulse 65, temperature 98.3 F (36.8 C), temperature source Oral, resp. rate 19, height 5' 0.5" (1.537 m), weight 70.308 kg (155 lb), last menstrual period 03/05/2014.  Physical Exam  Constitutional: She is oriented to person, place,  and time. She appears well-developed and well-nourished. No distress.  HENT:  Head: Normocephalic.  Eyes: Pupils are equal, round, and reactive to light.  Neck: Neck supple.  Respiratory: Effort normal.  GI: Soft. She exhibits no distension. There is no tenderness.  Genitourinary:  Bimanual exam: Cervix closed Uterus non tender, enlarged  Adnexa non tender, no masses bilaterally GC/Chlam, wet prep done; deferred; recently done  Chaperone present for exam.   Musculoskeletal: Normal range of motion.  Neurological: She is alert and oriented to person, place, and time.  Skin: Skin is  warm. She is not diaphoretic.  Psychiatric: Her behavior is normal.    MAU Course  Procedures  None  MDM UA  +fht The patient had a full workup including STI testing one week ago. She is encouraged to start prenatal care asap.   Assessment and Plan   A: 1. Abdominal pain in pregnancy    P: Discharge home in stable condition Start prenatal care ASAP Discussed the importance of informing ex-partner of chlamydia in order to prevent the spread First trimester warning signs discussed  Follow up with the HD   Iona HansenJennifer Irene Rasch, NP 05/23/2014 1:41 PM

## 2014-05-23 NOTE — Discharge Instructions (Signed)

## 2014-05-23 NOTE — MAU Note (Signed)
Lower abdominal pain X 1 week, intermittent. No vaginal D/C or bleeding.

## 2014-05-23 NOTE — Progress Notes (Signed)
Advised to seek prenatal care. List of providers given.

## 2014-06-22 NOTE — L&D Delivery Note (Signed)
Patient is 19 y.o. G1P0 [redacted]w[redacted]d admitted for induction of labor due to post-dates, hx of normal prenatal care   Delivery Note At 6:22 AM a viable female was delivered via Vaginal, Vacuum Investment banker, operational) (Presentation: OP ).  APGAR: 9, 9; weight 5 lb 15.6 oz (2710 g).   Placenta status: intact .  Cord: 3 vessel with the following complications: None  Patient was completed dilated and +2 in presentation. Patient pushed with good effort for 2.5 hours with some progression but with decreasing effort and increasing fetal caput. Exam with infant in OP position. Dr. Emelda Fear was called to bedside to evaluate for vacuum assistance. Vacuum was felt to be appropriate and patient consented to use. Kiwi vacuum was applied just anterior to the posterior fontanelle in midline position. With contractions, vacuum pressure increased to appropriate level and gentle traction provided. One pop-off occurred with first contraction; second push. Vacuum reapplied and pressure increased with good movement of fetal head. Vacuum pressure released in between contractions. Gentle traction with vacuum was provided for two additional contractions and fetal head delivered over intact perineum in frank OP presentation. Anterior and posterior shoulders delivered without difficulty. Nuchal cord x 1 was noted and infant delivered through with summersault maneuver. Remainder of infant was extracted from vagina and placed on mother's abdomen with nursing assistance. Meconium noted with delivery of infant. Spontaneous cry was noted. Delayed cord clamping x approximately 1 minute was performed. Placental delivered with in several minutes of the infant and was examined and noted to be intact. Uterus was massaged and noted to be firm with minimal bleeding. A superficial L interior labial laceration was noted that was repaired with single horizontal mattress stitch. Good hemostasis was noted. Uterus was again massaged and noted to be firm with small amount  of bleeding   Anesthesia: Epidural  Episiotomy:  None  Lacerations:  1st degree labial  Suture Repair: 3.0 vicryl Est. Blood Loss (mL):  87  Mom to postpartum.  Baby to Couplet care / Skin to Skin.  Delivery Supervised by Dr. Emelda Fear.   Fabio Asa 12/06/2014, 6:37 AM

## 2014-06-24 ENCOUNTER — Encounter (HOSPITAL_COMMUNITY): Payer: Self-pay

## 2014-06-24 ENCOUNTER — Inpatient Hospital Stay (HOSPITAL_COMMUNITY)
Admission: AD | Admit: 2014-06-24 | Discharge: 2014-06-24 | Disposition: A | Discharge status: Home / Self Care | Payer: Medicaid Other | Source: Ambulatory Visit | Attending: Obstetrics & Gynecology | Admitting: Obstetrics & Gynecology

## 2014-06-24 DIAGNOSIS — Z3A15 15 weeks gestation of pregnancy: Secondary | ICD-10-CM | POA: Diagnosis not present

## 2014-06-24 DIAGNOSIS — R109 Unspecified abdominal pain: Secondary | ICD-10-CM

## 2014-06-24 DIAGNOSIS — O0932 Supervision of pregnancy with insufficient antenatal care, second trimester: Secondary | ICD-10-CM | POA: Insufficient documentation

## 2014-06-24 DIAGNOSIS — O9989 Other specified diseases and conditions complicating pregnancy, childbirth and the puerperium: Secondary | ICD-10-CM | POA: Diagnosis not present

## 2014-06-24 DIAGNOSIS — O26899 Other specified pregnancy related conditions, unspecified trimester: Secondary | ICD-10-CM

## 2014-06-24 LAB — URINALYSIS, ROUTINE W REFLEX MICROSCOPIC
BILIRUBIN URINE: NEGATIVE
Glucose, UA: NEGATIVE mg/dL
Hgb urine dipstick: NEGATIVE
KETONES UR: 15 mg/dL — AB
LEUKOCYTES UA: NEGATIVE
NITRITE: NEGATIVE
Protein, ur: NEGATIVE mg/dL
Specific Gravity, Urine: 1.02 (ref 1.005–1.030)
UROBILINOGEN UA: 1 mg/dL (ref 0.0–1.0)
pH: 8.5 — ABNORMAL HIGH (ref 5.0–8.0)

## 2014-06-24 NOTE — MAU Note (Signed)
Pt was jumping at a trampoline park and now has abdominal pain.  Denies LO,VB.

## 2014-06-24 NOTE — Discharge Instructions (Signed)
°    ________________________________________     To schedule your Maternity Eligibility Appointment, please call [phone].  When you arrive for your appointment you must bring the following items or information listed below.  Your appointment will be rescheduled if you do not have these items or are 15 minutes late. If currently receiving Medicaid, you MUST bring: 1. Medicaid Card 2. Social Security Card 3. Picture ID 4. Proof of Pregnancy 5. Verification of current address if the address on Medicaid card is incorrect "postmarked mail" If not receiving Medicaid, you MUST bring: 1. Social Security Card 2. Picture ID 3. Birth Certificate (if available) Passport or *Green Card 4. Proof of Pregnancy 5. Verification of current address "postmarked mail" for each income presented. 6. Verification of insurance coverage, if any 7. Check stubs from each employer for the previous month (if unable to present check stub  for each week, we will accept check stub for the first and last week ill the same month.) If you can't locate check stubs, you must bring a letter from the employer(s) and it must have the following information on letterhead, typed, in English: o name of company o company telephone number o how long been with the company, if less than one month o how much person earns per hour o how many hours per week work o the gross pay the person earned for the previous month If you are 21 years old or less, you do not have to bring proof of income unless you work or live with the father of the baby and at that time we will need proof of income from you and/or the father of the baby. Green Card recipients are eligible for Medicaid for Pregnant Women (MPW)    

## 2014-06-24 NOTE — MAU Provider Note (Signed)
  History     CSN: [id number]  Arrival date and time: 06/24/14 1914   First Provider Initiated Contact with Patient 06/24/14 1934      Chief Complaint  Patient presents with  . Abdominal Pain   HPIpt is G1P0 at [redacted]w[redacted]d pregnant and presents with abd pain on and off since jumping on trampoline 2 weeks ago. Pt has not established prenatal care yet- waiting to get on mother's insurance Pt denies spotting, bleeding or LOF  Nurse Signed  MAU Note 06/24/2014 6:52 PM    Expand All Collapse All   Pt was jumping at a trampoline park and now has abdominal pain. Denies LO,VB.        Past Medical History  Diagnosis Date  . Chlamydia     History reviewed. No pertinent past surgical history.  Family History  Problem Relation Age of Onset  . Cancer Other   . Asthma Other     History  Substance Use Topics  . Smoking status: Never Smoker   . Smokeless tobacco: Not on file  . Alcohol Use: No    Allergies: No Known Allergies  No prescriptions prior to admission    Review of Systems  Constitutional: Negative for fever and chills.  Gastrointestinal: Positive for abdominal pain. Negative for nausea, vomiting, diarrhea and constipation.  Genitourinary: Negative for dysuria and urgency.   Physical Exam   Blood pressure 127/71, pulse 72, temperature 98.1 F (36.7 C), temperature source Oral, resp. rate 18, height  (1.549 m), weight 157 lb (71.215 kg), last menstrual period 03/05/2014.  Physical Exam  Nursing note and vitals reviewed. Constitutional: She is oriented to person, place, and time. She appears well-developed and well-nourished.  HENT:  Head: Normocephalic.  Eyes: Pupils are equal, round, and reactive to light.  Neck: Normal range of motion. Neck supple.  Cardiovascular: Normal rate.   Respiratory: Effort normal.  GI: Soft. She exhibits no distension. There is no tenderness.  FHT 147 with doppler  Genitourinary:  Vagina clean, cervix closed, long, NT   Musculoskeletal: Normal range of motion.  Neurological: She is alert and oriented to person, place, and time.  Skin: Skin is warm and dry.  Psychiatric: She has a normal mood and affect.    MAU Course  Procedures  Results for orders placed or performed during the hospital encounter of 06/24/14 (from the past 24 hour(s))  Urinalysis, Routine w reflex microscopic     Status: Abnormal   Collection Time: 06/24/14  6:33 PM  Result Value Ref Range   Color, Urine YELLOW YELLOW   APPearance CLEAR CLEAR   Specific Gravity, Urine 1.020 1.005 - 1.030   pH 8.5 (H) 5.0 - 8.0   Glucose, UA NEGATIVE NEGATIVE mg/dL   Hgb urine dipstick NEGATIVE NEGATIVE   Bilirubin Urine NEGATIVE NEGATIVE   Ketones, ur 15 (A) NEGATIVE mg/dL   Protein, ur NEGATIVE NEGATIVE mg/dL   Urobilinogen, UA 1.0 0.0 - 1.0 mg/dL   Nitrite NEGATIVE NEGATIVE   Leukocytes, UA NEGATIVE NEGATIVE   Assessment and Plan  abd pain in pregnancy No prenatal care Discussed importance of getting prenatal care ASAP  Nova Schmuhl 06/24/2014, 7:52 PM

## 2014-07-23 ENCOUNTER — Other Ambulatory Visit (HOSPITAL_COMMUNITY): Payer: Self-pay | Admitting: Physician Assistant

## 2014-07-23 DIAGNOSIS — Z3689 Encounter for other specified antenatal screening: Secondary | ICD-10-CM

## 2014-07-23 LAB — OB RESULTS CONSOLE ABO/RH: RH Type: POSITIVE

## 2014-07-23 LAB — OB RESULTS CONSOLE RPR: RPR: NONREACTIVE

## 2014-07-23 LAB — OB RESULTS CONSOLE RUBELLA ANTIBODY, IGM: Rubella: IMMUNE

## 2014-07-23 LAB — OB RESULTS CONSOLE HIV ANTIBODY (ROUTINE TESTING): HIV: NONREACTIVE

## 2014-07-23 LAB — OB RESULTS CONSOLE GC/CHLAMYDIA
Chlamydia: NEGATIVE
GC PROBE AMP, GENITAL: NEGATIVE

## 2014-07-23 LAB — OB RESULTS CONSOLE HEPATITIS B SURFACE ANTIGEN: Hepatitis B Surface Ag: NEGATIVE

## 2014-07-23 LAB — OB RESULTS CONSOLE ANTIBODY SCREEN: Antibody Screen: NEGATIVE

## 2014-07-23 LAB — OB RESULTS CONSOLE VARICELLA ZOSTER ANTIBODY, IGG: Varicella: IMMUNE

## 2014-07-25 ENCOUNTER — Ambulatory Visit (HOSPITAL_COMMUNITY)
Admission: RE | Admit: 2014-07-25 | Discharge: 2014-07-25 | Disposition: A | Discharge status: Home / Self Care | Payer: Medicaid Other | Source: Ambulatory Visit | Attending: Physician Assistant | Admitting: Physician Assistant

## 2014-07-25 DIAGNOSIS — Z3A20 20 weeks gestation of pregnancy: Secondary | ICD-10-CM | POA: Insufficient documentation

## 2014-07-25 DIAGNOSIS — Z3A21 21 weeks gestation of pregnancy: Secondary | ICD-10-CM | POA: Insufficient documentation

## 2014-07-25 DIAGNOSIS — Z36 Encounter for antenatal screening of mother: Secondary | ICD-10-CM | POA: Diagnosis not present

## 2014-07-25 DIAGNOSIS — Z3689 Encounter for other specified antenatal screening: Secondary | ICD-10-CM

## 2014-08-23 ENCOUNTER — Encounter (HOSPITAL_COMMUNITY): Payer: Self-pay | Admitting: Emergency Medicine

## 2014-08-23 ENCOUNTER — Emergency Department (HOSPITAL_COMMUNITY)
Admission: EM | Admit: 2014-08-23 | Discharge: 2014-08-23 | Disposition: A | Discharge status: Home / Self Care | Payer: Medicaid Other | Attending: Emergency Medicine | Admitting: Emergency Medicine

## 2014-08-23 DIAGNOSIS — O2342 Unspecified infection of urinary tract in pregnancy, second trimester: Secondary | ICD-10-CM | POA: Insufficient documentation

## 2014-08-23 DIAGNOSIS — Z3490 Encounter for supervision of normal pregnancy, unspecified, unspecified trimester: Secondary | ICD-10-CM

## 2014-08-23 DIAGNOSIS — Z8619 Personal history of other infectious and parasitic diseases: Secondary | ICD-10-CM | POA: Diagnosis not present

## 2014-08-23 DIAGNOSIS — R42 Dizziness and giddiness: Secondary | ICD-10-CM | POA: Diagnosis not present

## 2014-08-23 DIAGNOSIS — O9989 Other specified diseases and conditions complicating pregnancy, childbirth and the puerperium: Secondary | ICD-10-CM | POA: Diagnosis present

## 2014-08-23 DIAGNOSIS — Z3A24 24 weeks gestation of pregnancy: Secondary | ICD-10-CM | POA: Diagnosis not present

## 2014-08-23 LAB — URINALYSIS, ROUTINE W REFLEX MICROSCOPIC
Bilirubin Urine: NEGATIVE
Glucose, UA: NEGATIVE mg/dL
Hgb urine dipstick: NEGATIVE
Ketones, ur: NEGATIVE mg/dL
Nitrite: NEGATIVE
PH: 6.5 (ref 5.0–8.0)
Protein, ur: NEGATIVE mg/dL
SPECIFIC GRAVITY, URINE: 1.018 (ref 1.005–1.030)
Urobilinogen, UA: 0.2 mg/dL (ref 0.0–1.0)

## 2014-08-23 LAB — URINE MICROSCOPIC-ADD ON

## 2014-08-23 MED ORDER — CEPHALEXIN 500 MG PO CAPS: 500.0000 mg | ORAL_CAPSULE | Freq: Two times a day (BID) | ORAL | Status: DC

## 2014-08-23 MED ORDER — SODIUM CHLORIDE 0.9 % IV BOLUS (SEPSIS)
1000.0000 mL | Freq: Once | INTRAVENOUS | Status: AC
  Administered 2014-08-23: 1000 mL via INTRAVENOUS

## 2014-08-23 NOTE — ED Notes (Signed)
OB rapid response notified  

## 2014-08-23 NOTE — ED Notes (Signed)
Pt presents with dizziness for the past 3 days- states "it feels like the room is spinning when I stand up"- pt is [redacted] weeks pregnant last ultrasound in February was normal.  Pt denies abdominal pain or vaginal bleeding.  Denies N/V/D.  FHT 140bpm

## 2014-08-23 NOTE — ED Notes (Signed)
While ambulating pt, pt did not have any dizziness. No complaints at this time.

## 2014-08-23 NOTE — ED Provider Notes (Signed)
CSN: [id number]     Arrival date & time 08/23/14  1915 History   First MD Initiated Contact with Patient 08/23/14 1949     Chief Complaint  Patient presents with  . Dizziness     (Consider location/radiation/quality/duration/timing/severity/associated sxs/prior Treatment) Patient is a 19 y.o. female presenting with dizziness. The history is provided by the patient.  Dizziness Associated symptoms: no chest pain, no diarrhea, no headaches, no nausea, no shortness of breath, no vomiting and no weakness    patient presents with lightheadedness when she stands up. She's had for last few days. She feels that she may be dehydrated. She is around [redacted] weeks pregnant. She has had prenatal care. This is her first pregnancy. She feels baby moving still. Denies nausea vomiting or diarrhea. No abdominal pain. No dysuria. No vaginal discharge. Headache. No confusion. She states she feels that she is going to pass out not that the room is spinning. No chest pain. No Trouble breathing.  Past Medical History  Diagnosis Date  . Chlamydia    History reviewed. No pertinent past surgical history. Family History  Problem Relation Age of Onset  . Cancer Other   . Asthma Other    History  Substance Use Topics  . Smoking status: Never Smoker   . Smokeless tobacco: Not on file  . Alcohol Use: No   OB History    Gravida Para Term Preterm AB TAB SAB Ectopic Multiple Living   1         0     Review of Systems  Constitutional: Negative for activity change and appetite change.  Eyes: Negative for pain.  Respiratory: Negative for chest tightness and shortness of breath.   Cardiovascular: Negative for chest pain and leg swelling.  Gastrointestinal: Negative for nausea, vomiting, abdominal pain and diarrhea.  Genitourinary: Negative for flank pain.       Pregnant  Musculoskeletal: Negative for back pain and neck stiffness.  Skin: Negative for rash.  Neurological: Positive for dizziness and light-headedness.  Negative for weakness, numbness and headaches.  Psychiatric/Behavioral: Negative for behavioral problems.      Allergies  Review of patient's allergies indicates no known allergies.  Home Medications   Prior to Admission medications   Medication Sig Start Date End Date Taking? Authorizing Provider  Prenatal Vit-Fe Fumarate-FA (PRENATAL MULTIVITAMIN) TABS tablet Take 1 tablet by mouth daily at 12 noon.   Yes Historical Provider, MD  cephALEXin (KEFLEX) 500 MG capsule Take 1 capsule (500 mg total) by mouth 2 (two) times daily. 08/23/14   Juliet RudeNathan R. Madalin Hughart, MD   BP 110/66 mmHg  Pulse 75  Temp(Src) 98.2 F (36.8 C) (Oral)  Resp 20  Ht 5\' 1"  (1.549 m)  Wt 165 lb (74.844 kg)  BMI 31.19 kg/m2  SpO2 100%  LMP 03/05/2014 Physical Exam  Constitutional: She is oriented to person, place, and time. She appears well-developed and well-nourished.  HENT:  Head: Normocephalic and atraumatic.  Eyes: EOM are normal.  Neck: Neck supple.  Cardiovascular: Normal rate, regular rhythm and normal heart sounds.   No murmur heard. Pulmonary/Chest: Effort normal and breath sounds normal. No respiratory distress. She has no wheezes. She has no rales.  Abdominal: Soft. Bowel sounds are normal. She exhibits mass. She exhibits no distension. There is no rebound and no guarding.  Gravid to above umbilicus  Musculoskeletal: Normal range of motion.  Neurological: She is alert and oriented to person, place, and time. No cranial nerve deficit.  Skin: Skin is warm.  Psychiatric: She has a normal mood and affect. Her speech is normal.  Nursing note and vitals reviewed.   ED Course  Procedures (including critical care time) Labs Review Labs Reviewed  URINALYSIS, ROUTINE W REFLEX MICROSCOPIC - Abnormal; Notable for the following:    APPearance CLOUDY (*)    Leukocytes, UA MODERATE (*)    All other components within normal limits  URINE MICROSCOPIC-ADD ON - Abnormal; Notable for the following:    Squamous  Epithelial / LPF MANY (*)    Bacteria, UA MANY (*)    All other components within normal limits  URINE CULTURE    Imaging Review No results found.   EKG Interpretation None      MDM   Final diagnoses:  Pregnant  UTI in pregnancy, second trimester    Patient with lightheadedness. Likely due to pregnancy and volume changes. Patient feels better after IV fluid. Does have some bacteria in the urine and treated. Seen by [facility] nurse and had reassuring fetal heart tones. Will discharge home.    Juliet Rude. Rubin Payor, MD 08/24/14 [phone]

## 2014-08-23 NOTE — Progress Notes (Signed)
Pt is G1P0, 24wk3da, c/o dizziness. FHT 155bpm, good variability, no decels. No contractions on monitor. Notified Dr. Shawnie PonsPratt of pt status. OK to d/c monitoring.

## 2014-08-23 NOTE — Discharge Instructions (Signed)
Pregnancy and Urinary Tract Infection °A urinary tract infection (UTI) is a bacterial infection of the urinary tract. Infection of the urinary tract can include the ureters, kidneys (pyelonephritis), bladder (cystitis), and urethra (urethritis). All pregnant women should be screened for bacteria in the urinary tract. Identifying and treating a UTI will decrease the risk of preterm labor and developing more serious infections in both the mother and baby. °CAUSES °Bacteria germs cause almost all UTIs.  °RISK FACTORS °Many factors can increase your chances of getting a UTI during pregnancy. These include: °· Having a short urethra. °· Poor toilet and hygiene habits. °· Sexual intercourse. °· Blockage of urine along the urinary tract. °· Problems with the pelvic muscles or nerves. °· Diabetes. °· Obesity. °· Bladder problems after having several children. °· Previous history of UTI. °SIGNS AND SYMPTOMS  °· Pain, burning, or a stinging feeling when urinating. °· Suddenly feeling the need to urinate right away (urgency). °· Loss of bladder control (urinary incontinence). °· Frequent urination, more than is common with pregnancy. °· Lower abdominal or back discomfort. °· Cloudy urine. °· Blood in the urine (hematuria). °· Fever.  °When the kidneys are infected, the symptoms may be: °· Back pain. °· Flank pain on the right side more so than the left. °· Fever. °· Chills. °· Nausea. °· Vomiting. °DIAGNOSIS  °A urinary tract infection is usually diagnosed through urine tests. Additional tests and procedures are sometimes done. These may include: °· Ultrasound exam of the kidneys, ureters, bladder, and urethra. °· Looking in the bladder with a lighted tube (cystoscopy). °TREATMENT °Typically, UTIs can be treated with antibiotic medicines.  °HOME CARE INSTRUCTIONS  °· Only take over-the-counter or prescription medicines as directed by your health care provider. If you were prescribed antibiotics, take them as directed. Finish  them even if you start to feel better. °· Drink enough fluids to keep your urine clear or pale yellow. °· Do not have sexual intercourse until the infection is gone and your health care provider says it is okay. °· Make sure you are tested for UTIs throughout your pregnancy. These infections often come back.  °Preventing a UTI in the Future °· Practice good toilet habits. Always wipe from front to back. Use the tissue only once. °· Do not hold your urine. Empty your bladder as soon as possible when the urge comes. °· Do not douche or use deodorant sprays. °· Wash with soap and warm water around the genital area and the anus. °· Empty your bladder before and after sexual intercourse. °· Wear underwear with a cotton crotch. °· Avoid caffeine and carbonated drinks. They can irritate the bladder. °· Drink cranberry juice or take cranberry pills. This may decrease the risk of getting a UTI. °· Do not drink alcohol. °· Keep all your appointments and tests as scheduled.  °SEEK MEDICAL CARE IF:  °· Your symptoms get worse. °· You are still having fevers 2 or more days after treatment begins. °· You have a rash. °· You feel that you are having problems with medicines prescribed. °· You have abnormal vaginal discharge. °SEEK IMMEDIATE MEDICAL CARE IF:  °· You have back or flank pain. °· You have chills. °· You have blood in your urine. °· You have nausea and vomiting. °· You have contractions of your uterus. °· You have a gush of fluid from the vagina. °MAKE SURE YOU: °· Understand these instructions.   °· Will watch your condition.   °· Will get help right away if you are not doing   well or get worse.   °Document Released: 10/03/2010 Document Revised: 03/29/2013 Document Reviewed: 01/05/2013 °ExitCare® Patient Information ©2015 [employer]. This information is not intended to replace advice given to you by your health care provider. Make sure you discuss any questions you have with your health care provider. ° °

## 2014-08-25 LAB — URINE CULTURE

## 2014-10-08 ENCOUNTER — Inpatient Hospital Stay (HOSPITAL_COMMUNITY)
Admission: AD | Admit: 2014-10-08 | Discharge: 2014-10-08 | Disposition: A | Discharge status: Home / Self Care | Payer: Medicaid Other | Source: Ambulatory Visit | Attending: Obstetrics and Gynecology | Admitting: Obstetrics and Gynecology

## 2014-10-08 ENCOUNTER — Encounter (HOSPITAL_COMMUNITY): Payer: Self-pay | Admitting: *Deleted

## 2014-10-08 DIAGNOSIS — Z3A32 32 weeks gestation of pregnancy: Secondary | ICD-10-CM | POA: Diagnosis not present

## 2014-10-08 DIAGNOSIS — S80811A Abrasion, right lower leg, initial encounter: Secondary | ICD-10-CM

## 2014-10-08 DIAGNOSIS — M25561 Pain in right knee: Secondary | ICD-10-CM | POA: Insufficient documentation

## 2014-10-08 DIAGNOSIS — O9989 Other specified diseases and conditions complicating pregnancy, childbirth and the puerperium: Secondary | ICD-10-CM | POA: Insufficient documentation

## 2014-10-08 DIAGNOSIS — W109XXA Fall (on) (from) unspecified stairs and steps, initial encounter: Secondary | ICD-10-CM | POA: Insufficient documentation

## 2014-10-08 DIAGNOSIS — W010XXA Fall on same level from slipping, tripping and stumbling without subsequent striking against object, initial encounter: Secondary | ICD-10-CM

## 2014-10-08 NOTE — MAU Note (Signed)
Pt fell at 1020 this morning, fell down stairs, hit knees, did not hit abdomen.  Denies abd pain, bleeding or LOF.

## 2014-10-08 NOTE — MAU Note (Signed)
Faculty to come to unit and assess patient.

## 2014-10-08 NOTE — MAU Note (Signed)
Pt fell at 1020 while walking into work.

## 2014-10-08 NOTE — Discharge Instructions (Signed)
Rest, ice, compression, elevation of your Right leg.  You can clean the area with mild soap and water.  You can also take Tylenol safely during pregnancy for soreness/discomfort.  What Do I Need to Know About Injuries During Pregnancy? Injuries can happen during pregnancy. Minor falls and accidents usually do not harm you or your baby. However, any injury should be reported to your doctor. WHAT CAN I DO TO PROTECT MYSELF FROM INJURIES?  Remove rugs and loose objects on the floor.  Wear comfortable shoes that have a good grip. Do not wear high-heeled shoes.  Always wear your seat belt. The lap belt should be below your belly. Always practice safe driving.  Do not ride on a motorcycle.  Do not participate in high-impact activities or sports.  Avoid:  Walking on wet or slippery floors.  Fires.  Starting fires.  Lifting heavy pots of boiling or hot liquids.  Fixing electrical problems.  Only take medicine as told by your doctor.  Know your blood type and the blood type of the baby's father.  Call your local emergency services (911 in the [place] if you are a victim of domestic violence or assault. For help and support, contact the [employer]. WHEN SHOULD I GET HELP RIGHT AWAY?  You fall on your belly or have any high-impact accident or injury.  You have been a victim of domestic violence or any kind of violence.  You have been in a car accident.  You have bleeding from your vagina.  Fluid is leaking from your vagina.  You start to have belly cramping (contractions) or pain.  You feel weak or pass out (faint).  You start to throw up (vomit) after an injury.  You have been burned.  You have a stiff neck or neck pain.  You get a headache or have vision problems after an injury.  You do not feel the baby move or the baby is not moving as much as normal. Document Released: 07/11/2010 Document Revised: 10/23/2013 Document Reviewed:  03/15/2013 [facility] Patient Information 2015 [place], [place]. This information is not intended to replace advice given to you by your health care provider. Make sure you discuss any questions you have with your health care provider.  Abrasions An abrasion is a cut or scrape of the skin. Abrasions do not go through all layers of the skin. HOME CARE  If a bandage (dressing) was put on your wound, change it as told by your doctor. If the bandage sticks, soak it off with warm.  Wash the area with water and soap 2 times a day. Rinse off the soap. Pat the area dry with a clean towel.  Put on medicated cream (ointment) as told by your doctor.  Change your bandage right away if it gets wet or dirty.  Only take medicine as told by your doctor.  See your doctor within 24-48 hours to get your wound checked.  Check your wound for redness, puffiness (swelling), or yellowish-white fluid (pus). GET HELP RIGHT AWAY IF:   You have more pain in the wound.  You have redness, swelling, or tenderness around the wound.  You have pus coming from the wound.  You have a fever or lasting symptoms for more than 2-3 days.  You have a fever and your symptoms suddenly get worse.  You have a bad smell coming from the wound or bandage. MAKE SURE YOU:   Understand these instructions.  Will watch your condition.  Will get help  right away if you are not doing well or get worse. Document Released: 11/25/2007 Document Revised: 03/02/2012 Document Reviewed: 05/12/2011 [facility] Patient Information 2015 [place]. This information is not intended to replace advice given to you by your health care provider. Make sure you discuss any questions you have with your health care provider.

## 2014-10-08 NOTE — MAU Provider Note (Signed)
History     CSN: [id number]  Arrival date and time: 10/08/14 7829   First Provider Initiated Contact with Patient 10/08/14 2027      Chief Complaint  Patient presents with  . Fall   HPI  Patient is 19 y.o. G1P0 [redacted]w[redacted]d here with complaints of a fall today.  Patient reports that she was ambulating down some steps towards the parking lot when she misstepped and fell.  She reports that she fell on her knees.  She denies hitting her belly during the fall.  She denies any cracking or popping sounds/sensations.  Ambulating normally, with a small limp from the soreness in her R knee.  She has not taken any medications or done anything to help the knee.  Patient reports that the pain is sore in nature and is more prominent on the R knee.    +FM, denies LOF, VB, contractions, vaginal discharge.  OB History    Gravida Para Term Preterm AB TAB SAB Ectopic Multiple Living   1         0      Past Medical History  Diagnosis Date  . Chlamydia     History reviewed. No pertinent past surgical history.  Family History  Problem Relation Age of Onset  . Cancer Other   . Asthma Other     History  Substance Use Topics  . Smoking status: Never Smoker   . Smokeless tobacco: Not on file  . Alcohol Use: No    Allergies: No Known Allergies  Prescriptions prior to admission  Medication Sig Dispense Refill Last Dose  . Prenatal Vit-Fe Fumarate-FA (PRENATAL MULTIVITAMIN) TABS tablet Take 1 tablet by mouth daily at 12 noon.   10/07/2014 at Unknown time  . cephALEXin (KEFLEX) 500 MG capsule Take 1 capsule (500 mg total) by mouth 2 (two) times daily. (Patient not taking: Reported on 10/08/2014) 14 capsule 0     Review of Systems  Genitourinary:       +FM, No LOF, VB, vaginal discharge, or contractions  Musculoskeletal: Positive for falls.  Skin:       +skinned knee, sore R knee  Neurological: Negative for weakness.   Physical Exam   Blood pressure 118/80, pulse 91, temperature 98.2 F  (36.8 C), temperature source Oral, resp. rate 18, last menstrual period 03/05/2014.  Physical Exam  Constitutional: She appears well-developed and well-nourished. No distress.  HENT:  Head: Normocephalic and atraumatic.  Eyes: EOM are normal. No scleral icterus.  Neck: Normal range of motion.  Cardiovascular: Normal rate, regular rhythm and intact distal pulses.   Murmur heard. Respiratory: Effort normal and breath sounds normal. No respiratory distress.  GI: Soft. Bowel sounds are normal. There is no tenderness. There is no rebound.  gravid  Musculoskeletal: Normal range of motion. She exhibits tenderness (over the inferior lateral aspect of the right knee, healing 1cmx0.75cm excoriation present. ). She exhibits no edema.  No joint line TTP along R knee, no joint effusion, no bony abnormalities, AROM in tact, 5/5 muscle strength b/l LE  Skin: Skin is warm and dry.  No bleeding from excoriation, mildly TTP    Fetal monitoringBaseline: 145 bpm, Variability: Good {> 6 bpm) and Accelerations: reassuring Uterine activity:  mild uterine irritation  MAU Course  Procedures  MDM  NST reassuring, no contractions on toco Considered R knee xray, but patient able to bear weight and ambulate without difficulty.  No bony abnormalities or joint line TTP.  Assessment and Plan  Discussed fall  with patient.  Excoriation to R knee but very low suspicion for any fracture.  Encouraged RICE and Tylenol PRN soreness.  Return precautions discussed.  Patient to follow up for routine care next week with her OB as scheduled.  Delynn FlavinGottschalk, Ashly M, DO 10/08/2014, 8:27 PM    OB fellow attestation:  I have seen and examined this patient; I agree with above documentation in the resident's note.   Deirdre Pippinsmberia J Kaeser is a 19 y.o. G1P0 here for evaluation after a fall. Misjudged last step, slipped and fell on knee. Did not hit abdomen. Was able to get up and ambulate without assistance after the fall.  +FM,  denies LOF, VB, contractions, vaginal discharge.  PE: BP 118/80 mmHg  Pulse 91  Temp(Src) 98.2 F (36.8 C) (Oral)  Resp 18  LMP 03/05/2014 Gen: calm comfortable, NAD Resp: normal effort, no distress Abd: gravid  ROS, labs, PMH reviewed NST reactive  Toco uterine irritability, no contractions  Plan: #Right knee pain after a mechanical fall at ground level - Examination benign. Per [place] Knee Rules, patient is low risk and imaging not warranted. Recommend RICE precautions, tylenol as needed for pain.  - Low risk fall as patient did not injure abdomen. Monitored without evidence of contractions.  - NST reactive.  - Strict return precautions given. Return for vaginal bleeding, contractions, loss of fluid, decreased fetal movement or any other concerning symptoms.  - Fetal kick counts reinforced.  - Follow up in OB clinic within one week.   William DaltonMcEachern, Chonte Ricke, MD 9:06 PM

## 2014-10-30 LAB — OB RESULTS CONSOLE GBS: STREP GROUP B AG: POSITIVE

## 2014-10-30 LAB — OB RESULTS CONSOLE GC/CHLAMYDIA
Chlamydia: NEGATIVE
GC PROBE AMP, GENITAL: NEGATIVE

## 2014-10-31 ENCOUNTER — Inpatient Hospital Stay (HOSPITAL_COMMUNITY)
Admission: AD | Admit: 2014-10-31 | Discharge: 2014-10-31 | Disposition: A | Discharge status: Home / Self Care | Payer: Medicaid Other | Source: Ambulatory Visit | Attending: Obstetrics & Gynecology | Admitting: Obstetrics & Gynecology

## 2014-10-31 ENCOUNTER — Encounter (HOSPITAL_COMMUNITY): Payer: Self-pay | Admitting: *Deleted

## 2014-10-31 DIAGNOSIS — R0602 Shortness of breath: Secondary | ICD-10-CM | POA: Insufficient documentation

## 2014-10-31 DIAGNOSIS — O9989 Other specified diseases and conditions complicating pregnancy, childbirth and the puerperium: Secondary | ICD-10-CM | POA: Diagnosis not present

## 2014-10-31 DIAGNOSIS — R12 Heartburn: Secondary | ICD-10-CM | POA: Diagnosis present

## 2014-10-31 DIAGNOSIS — Z3A36 36 weeks gestation of pregnancy: Secondary | ICD-10-CM | POA: Diagnosis not present

## 2014-10-31 DIAGNOSIS — K219 Gastro-esophageal reflux disease without esophagitis: Secondary | ICD-10-CM

## 2014-10-31 MED ORDER — RANITIDINE HCL 150 MG PO TABS: 150.0000 mg | ORAL_TABLET | Freq: Two times a day (BID) | ORAL | Status: DC

## 2014-10-31 MED ORDER — GI COCKTAIL ~~LOC~~
30.0000 mL | Freq: Once | ORAL | Status: AC
  Administered 2014-10-31: 30 mL via ORAL
  Filled 2014-10-31: qty 30

## 2014-10-31 NOTE — MAU Provider Note (Signed)
History     CSN: [id number]  Arrival date and time: 10/31/14 1522   First Provider Initiated Contact with Patient 10/31/14 1631      Chief Complaint  Patient presents with  . Shortness of Breath   HPI Patient is 19 y.o. G1P0 2513w1d here with complaints of heartburn and sensation of not being able to take a deep breath in.  She reports that heartburn has been going on for at least a couple of weeks. SOB has been going on for only a day or so.  She reports that when she lays down to sleep on her back it is difficult to take a deep breath in.  She has to sit up or turn to her side to get good air.  Denies h/o asthma or breathing problems in the past.  Denies cough, congestion, fevers, chills, n/v.  +FM, denies LOF, VB, contractions, vaginal discharge.  OB History    Gravida Para Term Preterm AB TAB SAB Ectopic Multiple Living   1         0      Past Medical History  Diagnosis Date  . Chlamydia     Past Surgical History  Procedure Laterality Date  . No past surgeries      Family History  Problem Relation Age of Onset  . Cancer Other   . Asthma Other     History  Substance Use Topics  . Smoking status: Never Smoker   . Smokeless tobacco: Never Used  . Alcohol Use: No    Allergies: No Known Allergies  Prescriptions prior to admission  Medication Sig Dispense Refill Last Dose  . Prenatal Vit-Fe Fumarate-FA (PRENATAL MULTIVITAMIN) TABS tablet Take 1 tablet by mouth daily at 12 noon.   Past Week at Unknown time    Review of Systems  Constitutional: Negative for fever, chills and malaise/fatigue.  HENT: Negative for congestion and sore throat.   Eyes: Negative for blurred vision, double vision and photophobia.  Respiratory: Positive for shortness of breath. Negative for cough and wheezing.   Cardiovascular: Positive for orthopnea. Negative for chest pain, palpitations and leg swelling.  Gastrointestinal: Positive for heartburn. Negative for nausea, vomiting, abdominal  pain and diarrhea.  Genitourinary: Negative for dysuria, urgency, frequency, hematuria and flank pain.  Musculoskeletal: Positive for back pain (intermittent LBP). Negative for falls.  Neurological: Positive for headaches (mild one this am). Negative for dizziness and focal weakness.   Physical Exam   Blood pressure 124/78, pulse 76, temperature 97.6 F (36.4 C), temperature source Oral, resp. rate 18, height 5' 0.25" (1.53 m), weight 185 lb (83.915 kg), last menstrual period 03/05/2014, SpO2 100 %.  Physical Exam  Constitutional: She is oriented to person, place, and time. She appears well-developed and well-nourished. No distress.  HENT:  Head: Normocephalic and atraumatic.  Eyes: EOM are normal. No scleral icterus.  Neck: Normal range of motion. Neck supple.  Cardiovascular: Normal rate, regular rhythm, normal heart sounds and intact distal pulses.   Respiratory: Effort normal and breath sounds normal. No respiratory distress. She has no wheezes.  GI: Soft. There is no tenderness.  gravid  Musculoskeletal: Normal range of motion. She exhibits no edema.  Neurological: She is alert and oriented to person, place, and time.  Skin: Skin is warm and dry. She is not diaphoretic.  Psychiatric: She has a normal mood and affect. Her behavior is normal. Judgment and thought content normal.   Fetal monitoringBaseline: 140 bpm, Variability: Good {> 6 bpm) and Accelerations:  Reactive Uterine activityNone  MAU Course  Procedures  MDM NST reactive Resting and ambulatory O2 99% or better GI cocktail  Assessment and Plan  Suspect that SOB 2/2 decreased lung volumes in 3rd trimester of pregnancy.  WOB normal, air movement normal, oxygenation normal. -Reassurance -Encouraged side sleeping at this point in the pregnancy -Return precautions reviewed  Acid reflux -Responded well to GI cocktail -Zantac BID -f/u with [facility] as scheduled.  Raliegh IpGottschalk, Ashly M, DO 10/31/2014, 4:35 PM

## 2014-10-31 NOTE — MAU Note (Signed)
Urine in lab 

## 2014-10-31 NOTE — MAU Note (Addendum)
Started last night 2100, feels like she can't get catch her breath.  States not sure if is related to indigestion.  Denies hx of asthma. No cough, no fever.  Sinus congested noted (pt sniffing)

## 2014-10-31 NOTE — MAU Note (Signed)
Reported SHOB started last night 2100, states feels like she can't get catch her breath. Says she can only breath through one nostril.Has untreated acid reflux that she says may be causing it. No labored resp effort noted.

## 2014-10-31 NOTE — Discharge Instructions (Signed)
Heartburn During Pregnancy  °Heartburn is a burning sensation in the chest caused by stomach acid backing up into the esophagus. Heartburn is common in pregnancy because a certain hormone (progesterone) is released when a woman is pregnant. The progesterone hormone may relax the valve that separates the esophagus from the stomach. This allows acid to go up into the esophagus, causing heartburn. Heartburn may also happen in pregnancy because the enlarging uterus pushes up on the stomach, which pushes more acid into the esophagus. This is especially true in the later stages of pregnancy. Heartburn problems usually go away after giving birth. °CAUSES  °Heartburn is caused by stomach acid backing up into the esophagus. During pregnancy, this may result from various things, including:  °· The progesterone hormone. °· Changing hormone levels. °· The growing uterus pushing stomach acid upward. °· Large meals. °· Certain foods and drinks. °· Exercise. °· Increased acid production. °SIGNS AND SYMPTOMS  °· Burning pain in the chest or lower throat. °· Bitter taste in the mouth. °· Coughing. °DIAGNOSIS  °Your health care provider will typically diagnose heartburn by taking a careful history of your concern. Blood tests may be done to check for a certain type of bacteria that is associated with heartburn. Sometimes, heartburn is diagnosed by prescribing a heartburn medicine to see if the symptoms improve. In some cases, a procedure called an endoscopy may be done. In this procedure, a tube with a light and a camera on the end (endoscope) is used to examine the esophagus and the stomach. °TREATMENT  °Treatment will vary depending on the severity of your symptoms. Your health care provider may recommend: °· Over-the-counter medicines (antacids, acid reducers) for mild heartburn. °· Prescription medicines to decrease stomach acid or to protect your stomach lining. °· Certain changes in your diet. °· Elevating the head of your bed  by putting blocks under the legs. This helps prevent stomach acid from backing up into the esophagus when you are lying down. °HOME CARE INSTRUCTIONS  °· Only take over-the-counter or prescription medicines as directed by your health care provider. °· Raise the head of your bed by putting blocks under the legs if instructed to do so by your health care provider. Sleeping with more pillows is not effective because it only changes the position of your head. °· Do not exercise right after eating. °· Avoid eating 2-3 hours before bed. Do not lie down right after eating. °· Eat small meals throughout the day instead of three large meals. °· Identify foods and beverages that make your symptoms worse and avoid them. Foods you may want to avoid include: °¨ Peppers. °¨ Chocolate. °¨ High-fat foods, including fried foods. °¨ Spicy foods. °¨ Garlic and onions. °¨ Citrus fruits, including oranges, grapefruit, lemons, and limes. °¨ Food containing tomatoes or tomato products. °¨ Mint. °¨ Carbonated and caffeinated drinks. °¨ Vinegar. °SEEK MEDICAL CARE IF: °· You have abdominal pain of any kind. °· You feel burning in your upper abdomen or chest, especially after eating or lying down. °· You have nausea and vomiting. °· Your stomach feels upset after you eat. °SEEK IMMEDIATE MEDICAL CARE IF:  °· You have severe chest pain that goes down your arm or into your jaw or neck. °· You feel sweaty, dizzy, or light-headed. °· You become short of breath. °· You vomit blood. °· You have difficulty or pain with swallowing. °· You have bloody or black, tarry stools. °· You have episodes of heartburn more than 3 times a   week, for more than 2 weeks. °MAKE SURE YOU: °· Understand these instructions. °· Will watch your condition. °· Will get help right away if you are not doing well or get worse. °Document Released: 06/05/2000 Document Revised: 06/13/2013 Document Reviewed: 01/25/2013 °Exit[employer]® Patient Information ©2015 [employer]. This  information is not intended to replace advice given to you by your health care provider. Make sure you discuss any questions you have with your health care provider. ° °

## 2014-11-21 ENCOUNTER — Inpatient Hospital Stay (HOSPITAL_COMMUNITY)
Admission: AD | Admit: 2014-11-21 | Discharge: 2014-11-21 | Disposition: A | Discharge status: Home / Self Care | Payer: Medicaid Other | Source: Ambulatory Visit | Attending: Family Medicine | Admitting: Family Medicine

## 2014-11-21 ENCOUNTER — Encounter (HOSPITAL_COMMUNITY): Payer: Self-pay

## 2014-11-21 DIAGNOSIS — Z3A39 39 weeks gestation of pregnancy: Secondary | ICD-10-CM

## 2014-11-21 DIAGNOSIS — O26853 Spotting complicating pregnancy, third trimester: Secondary | ICD-10-CM | POA: Insufficient documentation

## 2014-11-21 DIAGNOSIS — O4693 Antepartum hemorrhage, unspecified, third trimester: Secondary | ICD-10-CM

## 2014-11-21 LAB — URINALYSIS, ROUTINE W REFLEX MICROSCOPIC
BILIRUBIN URINE: NEGATIVE
Glucose, UA: NEGATIVE mg/dL
Hgb urine dipstick: NEGATIVE
KETONES UR: NEGATIVE mg/dL
Leukocytes, UA: NEGATIVE
Nitrite: NEGATIVE
PH: 6 (ref 5.0–8.0)
Protein, ur: NEGATIVE mg/dL
Specific Gravity, Urine: 1.02 (ref 1.005–1.030)
Urobilinogen, UA: 0.2 mg/dL (ref 0.0–1.0)

## 2014-11-21 NOTE — Progress Notes (Signed)
Patient has not eaten all day, [facility] soft drink instructed patient to go ahead and eat.

## 2014-11-21 NOTE — MAU Note (Signed)
Pt presents complaining of spotting when she wiped but it has since resolved. Pt states she wants to know what that means. Reports generalized pain on abdomen. Denies other vaginal discharge. Reports good fetal movement.

## 2014-11-21 NOTE — Discharge Instructions (Signed)
Fetal Movement Counts °Patient Name: __________________________________________________ Patient Due Date: ____________________ °Performing a fetal movement count is highly recommended in high-risk pregnancies, but it is good for every pregnant woman to do. Your health care provider may ask you to start counting fetal movements at 28 weeks of the pregnancy. Fetal movements often increase: °· After eating a full meal. °· After physical activity. °· After eating or drinking something sweet or cold. °· At rest. °Pay attention to when you feel the baby is most active. This will help you notice a pattern of your baby's sleep and wake cycles and what factors contribute to an increase in fetal movement. It is important to perform a fetal movement count at the same time each day when your baby is normally most active.  °HOW TO COUNT FETAL MOVEMENTS °1. Find a quiet and comfortable area to sit or lie down on your left side. Lying on your left side provides the best blood and oxygen circulation to your baby. °2. Write down the day and time on a sheet of paper or in a journal. °3. Start counting kicks, flutters, swishes, rolls, or jabs in a 2-hour period. You should feel at least 10 movements within 2 hours. °4. If you do not feel 10 movements in 2 hours, wait 2-3 hours and count again. Look for a change in the pattern or not enough counts in 2 hours. °SEEK MEDICAL CARE IF: °· You feel less than 10 counts in 2 hours, tried twice. °· There is no movement in over an hour. °· The pattern is changing or taking longer each day to reach 10 counts in 2 hours. °· You feel the baby is not moving as he or she usually does. °Date: ____________ Movements: ____________ Start time: ____________ Finish time: ____________  °Date: ____________ Movements: ____________ Start time: ____________ Finish time: ____________ °Date: ____________ Movements: ____________ Start time: ____________ Finish time: ____________ °Date: ____________ Movements:  ____________ Start time: ____________ Finish time: ____________ °Date: ____________ Movements: ____________ Start time: ____________ Finish time: ____________ °Date: ____________ Movements: ____________ Start time: ____________ Finish time: ____________ °Date: ____________ Movements: ____________ Start time: ____________ Finish time: ____________ °Date: ____________ Movements: ____________ Start time: ____________ Finish time: ____________  °Date: ____________ Movements: ____________ Start time: ____________ Finish time: ____________ °Date: ____________ Movements: ____________ Start time: ____________ Finish time: ____________ °Date: ____________ Movements: ____________ Start time: ____________ Finish time: ____________ °Date: ____________ Movements: ____________ Start time: ____________ Finish time: ____________ °Date: ____________ Movements: ____________ Start time: ____________ Finish time: ____________ °Date: ____________ Movements: ____________ Start time: ____________ Finish time: ____________ °Date: ____________ Movements: ____________ Start time: ____________ Finish time: ____________  °Date: ____________ Movements: ____________ Start time: ____________ Finish time: ____________ °Date: ____________ Movements: ____________ Start time: ____________ Finish time: ____________ °Date: ____________ Movements: ____________ Start time: ____________ Finish time: ____________ °Date: ____________ Movements: ____________ Start time: ____________ Finish time: ____________ °Date: ____________ Movements: ____________ Start time: ____________ Finish time: ____________ °Date: ____________ Movements: ____________ Start time: ____________ Finish time: ____________ °Date: ____________ Movements: ____________ Start time: ____________ Finish time: ____________  °Date: ____________ Movements: ____________ Start time: ____________ Finish time: ____________ °Date: ____________ Movements: ____________ Start time: ____________ Finish  time: ____________ °Date: ____________ Movements: ____________ Start time: ____________ Finish time: ____________ °Date: ____________ Movements: ____________ Start time: ____________ Finish time: ____________ °Date: ____________ Movements: ____________ Start time: ____________ Finish time: ____________ °Date: ____________ Movements: ____________ Start time: ____________ Finish time: ____________ °Date: ____________ Movements: ____________ Start time: ____________ Finish time: ____________  °Date: ____________ Movements: ____________ Start time: ____________ Finish   time: ____________ °Date: ____________ Movements: ____________ Start time: ____________ Finish time: ____________ °Date: ____________ Movements: ____________ Start time: ____________ Finish time: ____________ °Date: ____________ Movements: ____________ Start time: ____________ Finish time: ____________ °Date: ____________ Movements: ____________ Start time: ____________ Finish time: ____________ °Date: ____________ Movements: ____________ Start time: ____________ Finish time: ____________ °Date: ____________ Movements: ____________ Start time: ____________ Finish time: ____________  °Date: ____________ Movements: ____________ Start time: ____________ Finish time: ____________ °Date: ____________ Movements: ____________ Start time: ____________ Finish time: ____________ °Date: ____________ Movements: ____________ Start time: ____________ Finish time: ____________ °Date: ____________ Movements: ____________ Start time: ____________ Finish time: ____________ °Date: ____________ Movements: ____________ Start time: ____________ Finish time: ____________ °Date: ____________ Movements: ____________ Start time: ____________ Finish time: ____________ °Date: ____________ Movements: ____________ Start time: ____________ Finish time: ____________  °Date: ____________ Movements: ____________ Start time: ____________ Finish time: ____________ °Date: ____________  Movements: ____________ Start time: ____________ Finish time: ____________ °Date: ____________ Movements: ____________ Start time: ____________ Finish time: ____________ °Date: ____________ Movements: ____________ Start time: ____________ Finish time: ____________ °Date: ____________ Movements: ____________ Start time: ____________ Finish time: ____________ °Date: ____________ Movements: ____________ Start time: ____________ Finish time: ____________ °Date: ____________ Movements: ____________ Start time: ____________ Finish time: ____________  °Date: ____________ Movements: ____________ Start time: ____________ Finish time: ____________ °Date: ____________ Movements: ____________ Start time: ____________ Finish time: ____________ °Date: ____________ Movements: ____________ Start time: ____________ Finish time: ____________ °Date: ____________ Movements: ____________ Start time: ____________ Finish time: ____________ °Date: ____________ Movements: ____________ Start time: ____________ Finish time: ____________ °Date: ____________ Movements: ____________ Start time: ____________ Finish time: ____________ °Document Released: 07/08/2006 Document Revised: 10/23/2013 Document Reviewed: 04/04/2012 °Exit[employer]® Patient Information ©2015 [employer]. This information is not intended to replace advice given to you by your health care provider. Make sure you discuss any questions you have with your health care provider. °Vaginal Delivery °During delivery, your health care provider will help you give birth to your baby. During a vaginal delivery, you will work to push the baby out of your vagina. However, before you can push your baby out, a few things need to happen. The opening of your uterus (cervix) has to soften, thin out, and open up (dilate) all the way to 10 cm. Also, your baby has to move down from the uterus into your vagina.  °SIGNS OF LABOR  °Your health care provider will first need to make sure you are in labor.  Signs of labor include:  °· Passing what is called the mucous plug before labor begins. This is a small amount of blood-stained mucus. °· Having regular, painful uterine contractions.   °· The time between contractions gets shorter.   °· The discomfort and pain gradually get more intense. °· Contraction pains get worse when walking and do not go away when resting.   °· Your cervix becomes thinner (effacement) and dilates. °BEFORE THE DELIVERY °Once you are in labor and admitted into the hospital or care center, your health care provider may do the following:  °5. Perform a complete physical exam. °6. Review any complications related to pregnancy or labor.  °7. Check your blood pressure, pulse, temperature, and heart rate (vital signs).   °8. Determine if, and when, the rupture of amniotic membranes occurred. °9. Do a vaginal exam (using a sterile glove and lubricant) to determine:   °1. The position (presentation) of the baby. Is the baby's head presenting first (vertex) in the birth canal (vagina), or are the feet or buttocks first (breech)?   °2. The level (station) of the baby's head within the birth canal.   °3.   The effacement and dilatation of the cervix.   °10. An electronic fetal monitor is usually placed on your abdomen when you first arrive. This is used to monitor your contractions and the baby's heart rate. °1. When the monitor is on your abdomen (external fetal monitor), it can only pick up the frequency and length of your contractions. It cannot tell the strength of your contractions. °2. If it becomes necessary for your health care provider to know exactly how strong your contractions are or to see exactly what the baby's heart rate is doing, an internal monitor may be inserted into your vagina and uterus. Your health care provider will discuss the benefits and risks of using an internal monitor and obtain your permission before inserting the device. °3. Continuous fetal monitoring may be needed if you  have an epidural, are receiving certain medicines (such as oxytocin), or have pregnancy or labor complications. °11. An IV access tube may be placed into a vein in your arm to deliver fluids and medicines if necessary. °THREE STAGES OF LABOR AND DELIVERY °Normal labor and delivery is divided into three stages. °First Stage °This stage starts when you begin to contract regularly and your cervix begins to efface and dilate. It ends when your cervix is completely open (fully dilated). The first stage is the longest stage of labor and can last from 3 hours to 15 hours.  °Several methods are available to help with labor pain. You and your health care provider will decide which option is best for you. Options include:  °· Opioid medicines. These are strong pain medicines that you can get through your IV tube or as a shot into your muscle. These medicines lessen pain but do not make it go away completely.  °· Epidural. A medicine is given through a thin tube that is inserted in your back. The medicine numbs the lower part of your body and prevents any pain in that area. °· Paracervical pain medicine. This is an injection of an anesthetic on each side of your cervix.   °· You may request natural childbirth, which does not involve the use of pain medicines or an epidural during labor and delivery. Instead, you will use other things, such as breathing exercises, to help cope with the pain. °Second Stage °The second stage of labor begins when your cervix is fully dilated at 10 cm. It continues until you push your baby down through the birth canal and the baby is born. This stage can take only minutes or several hours. °· The location of your baby's head as it moves through the birth canal is reported as a number called a station. If the baby's head has not started its descent, the station is described as being at minus 3 (-3). When your baby's head is at the zero station, it is at the middle of the birth canal and is engaged  in the pelvis. The station of your baby helps indicate the progress of the second stage of labor. °· When your baby is born, your health care provider may hold the baby with his or her head lowered to prevent amniotic fluid, mucus, and blood from getting into the baby's lungs. The baby's mouth and nose may be suctioned with a small bulb syringe to remove any additional fluid. °· Your health care provider may then place the baby on your stomach. It is important to keep the baby from getting cold. To do this, the health care provider will dry the baby off, place the   baby directly on your skin (with no blankets between you and the baby), and cover the baby with warm, dry blankets.   °· The umbilical cord is cut. °Third Stage °During the third stage of labor, your health care provider will deliver the placenta (afterbirth) and make sure your bleeding is under control. The delivery of the placenta usually takes about 5 minutes but can take up to 30 minutes. After the placenta is delivered, a medicine may be given either by IV or injection to help contract the uterus and control bleeding. If you are planning to breastfeed, you can try to do so now. °After you deliver the placenta, your uterus should contract and get very firm. If your uterus does not remain firm, your health care provider will massage it. This is important because the contraction of the uterus helps cut off bleeding at the site where the placenta was attached to your uterus. If your uterus does not contract properly and stay firm, you may continue to bleed heavily. If there is a lot of bleeding, medicines may be given to contract the uterus and stop the bleeding.  °Document Released: 03/17/2008 Document Revised: 10/23/2013 Document Reviewed: 11/27/2012 °ExitCare® Patient Information ©2015 [employer]. This information is not intended to replace advice given to you by your health care provider. Make sure you discuss any questions you have with your  health care provider. ° °

## 2014-11-21 NOTE — MAU Provider Note (Signed)
  History     CSN: [id number]  Arrival date and time: 11/21/14 1345   None     Chief Complaint  Patient presents with  . Labor Eval   HPI This is a 19 y.o. G1P0 presenting at 3870w1d with complaint of spotting this AM. Reports dime sized amount of bright red blood mixed with mucous in her underwear this AM. No leakage of fluid. Hasn't had any further bleeding. Not feeling any contractions. Feeling regular fetal movement. Denies having intercourse recently. She hasn't eaten yet today. She has an OB appointment this afternoon but was worried.  Pregnancy has been uncomplicated  OB History    Gravida Para Term Preterm AB TAB SAB Ectopic Multiple Living   1         0      Past Medical History  Diagnosis Date  . Chlamydia     Past Surgical History  Procedure Laterality Date  . No past surgeries      Family History  Problem Relation Age of Onset  . Cancer Other   . Asthma Other     History  Substance Use Topics  . Smoking status: Never Smoker   . Smokeless tobacco: Never Used  . Alcohol Use: No    Allergies: No Known Allergies  Prescriptions prior to admission  Medication Sig Dispense Refill Last Dose  . Prenatal Vit-Fe Fumarate-FA (PRENATAL MULTIVITAMIN) TABS tablet Take 1 tablet by mouth daily at 12 noon.   11/20/2014 at Unknown time  . ranitidine (ZANTAC) 150 MG tablet Take 1 tablet (150 mg total) by mouth 2 (two) times daily. 60 tablet 1 11/20/2014 at Unknown time    Review of Systems  Constitutional: Negative for fever.  Respiratory: Negative for shortness of breath.   Cardiovascular: Negative for chest pain.  Gastrointestinal: Negative for nausea, vomiting and abdominal pain.  Genitourinary: Negative for dysuria.  Musculoskeletal: Negative for back pain.  Neurological: Negative for headaches.   Physical Exam   Blood pressure 132/78, pulse 67, temperature 97.9 F (36.6 C), temperature source Oral, resp. rate 18, height 5\' 1"  (1.549 m), weight 187 lb (84.823  kg), last menstrual period 03/05/2014.  Physical Exam  Constitutional: She appears well-developed and well-nourished.  HENT:  Head: Normocephalic.  Right Ear: External ear normal.  Respiratory: Effort normal.  OB: external genitalia normal. No bleeding. Cervix fingertip20%-3    MAU Course  Procedures  MDM 18 y.o. G1P0 at 6370w1d with spotting this AM that has resolved. No further bleeding. No contractions or LOF.  Physical exam without signification cervical change.  No contractions on monitoring FHT reassuring, Category I Assessment and Plan  A: 19 yo G1P0 at 39 wk 1 d with limited episode of spotting, expect passed mucous plug. Not in active labor.  FHT Category I P: Return labor precautions given      Follow-up with OB provider   [facility] IncWILLIAMS,MARIE 11/21/2014, 2:28 PM

## 2014-11-27 ENCOUNTER — Other Ambulatory Visit (HOSPITAL_COMMUNITY): Payer: Self-pay | Admitting: Urology

## 2014-11-27 DIAGNOSIS — O480 Post-term pregnancy: Secondary | ICD-10-CM

## 2014-11-30 ENCOUNTER — Ambulatory Visit (HOSPITAL_COMMUNITY)
Admission: RE | Admit: 2014-11-30 | Discharge: 2014-11-30 | Disposition: A | Discharge status: Home / Self Care | Payer: Medicaid Other | Source: Ambulatory Visit | Attending: Urology | Admitting: Urology

## 2014-11-30 ENCOUNTER — Other Ambulatory Visit (HOSPITAL_COMMUNITY): Payer: Self-pay | Admitting: Urology

## 2014-11-30 ENCOUNTER — Other Ambulatory Visit (HOSPITAL_COMMUNITY): Payer: Medicaid Other

## 2014-11-30 ENCOUNTER — Encounter (HOSPITAL_COMMUNITY): Payer: Self-pay

## 2014-11-30 ENCOUNTER — Ambulatory Visit (HOSPITAL_COMMUNITY): Payer: No Typology Code available for payment source

## 2014-11-30 ENCOUNTER — Ambulatory Visit (HOSPITAL_COMMUNITY)
Admission: RE | Admit: 2014-11-30 | Discharge: 2014-11-30 | Disposition: A | Discharge status: Home / Self Care | Payer: Medicaid Other | Source: Ambulatory Visit | Attending: Physician Assistant | Admitting: Physician Assistant

## 2014-11-30 DIAGNOSIS — O480 Post-term pregnancy: Secondary | ICD-10-CM

## 2014-11-30 DIAGNOSIS — Z3A40 40 weeks gestation of pregnancy: Secondary | ICD-10-CM | POA: Insufficient documentation

## 2014-12-03 ENCOUNTER — Encounter (HOSPITAL_COMMUNITY): Payer: Self-pay | Admitting: *Deleted

## 2014-12-03 ENCOUNTER — Telehealth (HOSPITAL_COMMUNITY): Payer: Self-pay | Admitting: *Deleted

## 2014-12-03 NOTE — Telephone Encounter (Signed)
Preadmission screen  

## 2014-12-04 ENCOUNTER — Encounter (HOSPITAL_COMMUNITY): Payer: Self-pay

## 2014-12-04 ENCOUNTER — Ambulatory Visit (HOSPITAL_COMMUNITY)
Admission: RE | Admit: 2014-12-04 | Discharge: 2014-12-04 | Disposition: A | Discharge status: Home / Self Care | Payer: Medicaid Other | Source: Ambulatory Visit | Attending: Physician Assistant | Admitting: Physician Assistant

## 2014-12-04 ENCOUNTER — Inpatient Hospital Stay (HOSPITAL_COMMUNITY)
Admission: AD | Admit: 2014-12-04 | Discharge: 2014-12-07 | DRG: 775 | Disposition: A | Discharge status: Home / Self Care | Payer: Medicaid Other | Source: Ambulatory Visit | Attending: Obstetrics and Gynecology | Admitting: Obstetrics and Gynecology

## 2014-12-04 DIAGNOSIS — E669 Obesity, unspecified: Secondary | ICD-10-CM | POA: Diagnosis present

## 2014-12-04 DIAGNOSIS — Z3A41 41 weeks gestation of pregnancy: Secondary | ICD-10-CM | POA: Insufficient documentation

## 2014-12-04 DIAGNOSIS — O99824 Streptococcus B carrier state complicating childbirth: Secondary | ICD-10-CM | POA: Diagnosis present

## 2014-12-04 DIAGNOSIS — O480 Post-term pregnancy: Secondary | ICD-10-CM | POA: Diagnosis not present

## 2014-12-04 DIAGNOSIS — O9962 Diseases of the digestive system complicating childbirth: Secondary | ICD-10-CM | POA: Diagnosis present

## 2014-12-04 DIAGNOSIS — K219 Gastro-esophageal reflux disease without esophagitis: Secondary | ICD-10-CM | POA: Diagnosis present

## 2014-12-04 DIAGNOSIS — O99214 Obesity complicating childbirth: Secondary | ICD-10-CM | POA: Diagnosis present

## 2014-12-04 DIAGNOSIS — O0933 Supervision of pregnancy with insufficient antenatal care, third trimester: Secondary | ICD-10-CM | POA: Diagnosis not present

## 2014-12-04 LAB — CBC
HCT: 35.6 % — ABNORMAL LOW (ref 36.0–46.0)
Hemoglobin: 12.1 g/dL (ref 12.0–15.0)
MCH: 30.6 pg (ref 26.0–34.0)
MCHC: 34 g/dL (ref 30.0–36.0)
MCV: 89.9 fL (ref 78.0–100.0)
Platelets: 133 10*3/uL — ABNORMAL LOW (ref 150–400)
RBC: 3.96 MIL/uL (ref 3.87–5.11)
RDW: 13.3 % (ref 11.5–15.5)
WBC: 9.2 10*3/uL (ref 4.0–10.5)

## 2014-12-04 LAB — TYPE AND SCREEN
ABO/RH(D): O POS
Antibody Screen: NEGATIVE

## 2014-12-04 MED ORDER — ACETAMINOPHEN 325 MG PO TABS
650.0000 mg | ORAL_TABLET | ORAL | Status: DC | PRN
  Administered 2014-12-06: 650 mg via ORAL
  Filled 2014-12-04 (×2): qty 2

## 2014-12-04 MED ORDER — LACTATED RINGERS IV SOLN
500.0000 mL | INTRAVENOUS | Status: DC | PRN
  Administered 2014-12-05 – 2014-12-06 (×5): 500 mL via INTRAVENOUS

## 2014-12-04 MED ORDER — ONDANSETRON HCL 4 MG/2ML IJ SOLN: 4.0000 mg | Freq: Four times a day (QID) | INTRAMUSCULAR | Status: DC | PRN

## 2014-12-04 MED ORDER — PENICILLIN G POTASSIUM 5000000 UNITS IJ SOLR
5.0000 10*6.[IU] | Freq: Once | INTRAVENOUS | Status: DC
  Filled 2014-12-04: qty 5

## 2014-12-04 MED ORDER — TERBUTALINE SULFATE 1 MG/ML IJ SOLN: 0.2500 mg | Freq: Once | INTRAMUSCULAR | Status: AC | PRN

## 2014-12-04 MED ORDER — OXYTOCIN BOLUS FROM INFUSION
500.0000 mL | INTRAVENOUS | Status: DC
  Administered 2014-12-06: 500 mL via INTRAVENOUS

## 2014-12-04 MED ORDER — CITRIC ACID-SODIUM CITRATE 334-500 MG/5ML PO SOLN
30.0000 mL | ORAL | Status: DC | PRN
  Administered 2014-12-05: 30 mL via ORAL
  Filled 2014-12-04: qty 15

## 2014-12-04 MED ORDER — MISOPROSTOL 200 MCG PO TABS
50.0000 ug | ORAL_TABLET | ORAL | Status: DC | PRN
  Administered 2014-12-04 – 2014-12-05 (×2): 50 ug via ORAL
  Filled 2014-12-04 (×2): qty 0.5

## 2014-12-04 MED ORDER — OXYCODONE-ACETAMINOPHEN 5-325 MG PO TABS: 2.0000 | ORAL_TABLET | ORAL | Status: DC | PRN

## 2014-12-04 MED ORDER — PENICILLIN G POTASSIUM 5000000 UNITS IJ SOLR
2.5000 10*6.[IU] | INTRAVENOUS | Status: DC
  Filled 2014-12-04 (×2): qty 2.5

## 2014-12-04 MED ORDER — OXYTOCIN 40 UNITS IN LACTATED RINGERS INFUSION - SIMPLE MED: 62.5000 mL/h | INTRAVENOUS | Status: DC

## 2014-12-04 MED ORDER — LIDOCAINE HCL (PF) 1 % IJ SOLN
30.0000 mL | INTRAMUSCULAR | Status: DC | PRN
  Filled 2014-12-04: qty 30

## 2014-12-04 MED ORDER — OXYCODONE-ACETAMINOPHEN 5-325 MG PO TABS: 1.0000 | ORAL_TABLET | ORAL | Status: DC | PRN

## 2014-12-04 MED ORDER — LACTATED RINGERS IV SOLN
INTRAVENOUS | Status: DC
  Administered 2014-12-04 – 2014-12-05 (×6): via INTRAVENOUS

## 2014-12-04 NOTE — H&P (Signed)
LABOR ADMISSION HISTORY AND PHYSICAL  Anita Johnson is a 19 y.o. female G1P0 with IUP at [redacted]w[redacted]d by LMP presenting for induction of labor due to post dates. She has not felt any contractions. She was seen by MFM today due to postdates pregnancy with low normal AFI and recommended for IOL. She reports +FMs, No LOF, no VB, no blurry vision, headaches or peripheral edema, and RUQ pain.  She plans on breast feeding. She does not plan on using anything for postpartum birth control; may use condoms.   Dating: By LMP consistent with 20 week utlrasound --->  Estimated Date of Delivery: 11/27/14  Sono:   @[redacted]w[redacted]d , CWD, cephalic presentation @[redacted]w[redacted]d , CWD, normal anatomy 451g, 68% EFW  Prenatal History/Complications: 1. Chlamydia and gonorrhea in early pregnancy, treated 04/2014. Neg 07/23/14 2. GBS positive  3. Late prenatal care   Past Medical History: Past Medical History  Diagnosis Date  . Chlamydia     Past Surgical History: Past Surgical History  Procedure Laterality Date  . No past surgeries      Obstetrical History: OB History    Gravida Para Term Preterm AB TAB SAB Ectopic Multiple Living   1         0      Social History: History   Social History  . Marital Status: Single    Spouse Name: N/A  . Number of Children: N/A  . Years of Education: N/A   Social History Main Topics  . Smoking status: Never Smoker   . Smokeless tobacco: Never Used  . Alcohol Use: No  . Drug Use: No  . Sexual Activity: Yes    Birth Control/ Protection: None   Other Topics Concern  . Not on file   Social History Narrative    Family History: Family History  Problem Relation Age of Onset  . Cancer Other   . Asthma Other   . Asthma Mother   . Asthma Brother   . Cancer Maternal Grandmother   . Cancer Paternal Grandmother     Allergies: No Known Allergies  Prescriptions prior to admission  Medication Sig Dispense Refill Last Dose  . Prenatal Vit-Fe Fumarate-FA (PRENATAL  MULTIVITAMIN) TABS tablet Take 1 tablet by mouth daily at 12 noon.   Taking  . ranitidine (ZANTAC) 150 MG tablet Take 1 tablet (150 mg total) by mouth 2 (two) times daily. 60 tablet 1 Taking     Review of Systems   All systems reviewed and negative except as stated in HPI  Last menstrual period 02/20/2014. General appearance: alert, cooperative and no distress Lungs: clear to auscultation bilaterally Heart: regular rate and rhythm Abdomen: soft, non-tender; bowel sounds normal Pelvic: external genitalia normal. Cervix 1-2 cm, 40% effaced, intermediate consistency, posterior, -2 Extremities: Homans sign is negative, no sign of DVT Presentation: cephalic confirmed by bedside ultrasound Fetal monitoringBaseline: 150 bpm, Variability: Good {> 6 bpm), Accelerations: Reactive and Decelerations: Early  Deceleration x 1 Uterine activity irregular, every 5 minutes, mild      Prenatal labs: ABO, Rh: O/Positive/-- (02/01 0000) Antibody: Negative (02/01 0000) Rubella:   RPR: Nonreactive (02/01 0000)  HBsAg: Negative (02/01 0000)  HIV: Non-reactive (02/01 0000)  GBS: Positive (05/10 0000)  1 hr Glucola normal, 81 Genetic screening  Too late Anatomy US Too late  Prenatal Transfer Tool  Maternal Diabetes: No Genetic Screening:  - too late Maternal Ultrasounds/Referrals: Normal Fetal Ultrasounds or other Referrals:  None Maternal Substance Abuse:  No Significant Maternal Medications:  None Significant  Maternal Lab Results: Lab values include: Group B Strep positive  No results found for this or any previous visit (from the past 24 hour(s)).  Patient Active Problem List   Diagnosis Date Noted  . Post-dates pregnancy 12/04/2014  . [redacted] weeks gestation of pregnancy   . [redacted] weeks gestation of pregnancy   . Post-term pregnancy, 40-42 weeks of gestation   . Encounter for fetal anatomic survey   . [redacted] weeks gestation of pregnancy     Assessment: Anita Johnson is a 19 y.o. G1P0 at  [redacted]w[redacted]d here for induction of labor due to post dates  #Labor: Induction with cervical ripening. Will start with cytotec buccal 50 mcg q4 hours. Can consider foley bulb as well #Pain: Patient unclear yet if she will want an epidural #FWB: Continuous monitoring, currently Cat I #ID:  GBS positive. Prophylaxis with penicillin when in active labor #MOF: breast #MOC:none  Patient history, exam, assessment and plan discussed with Illene Bolus, CNM    Fabio Asa 12/04/2014, 8:32 PM

## 2014-12-04 NOTE — Plan of Care (Signed)
Problem: Consults Goal: Birthing Suites Patient Information Press F2 to bring up selections list Outcome: Completed/Met Date Met:  12/04/14  Pt > [redacted] weeks EGA and Inpatient induction, GBS+

## 2014-12-05 ENCOUNTER — Inpatient Hospital Stay (HOSPITAL_COMMUNITY): Payer: Medicaid Other | Admitting: Anesthesiology

## 2014-12-05 ENCOUNTER — Encounter (HOSPITAL_COMMUNITY): Payer: Self-pay | Admitting: Anesthesiology

## 2014-12-05 LAB — ABO/RH: ABO/RH(D): O POS

## 2014-12-05 LAB — RPR: RPR Ser Ql: NONREACTIVE

## 2014-12-05 MED ORDER — PENICILLIN G POTASSIUM 5000000 UNITS IJ SOLR
5.0000 10*6.[IU] | Freq: Once | INTRAMUSCULAR | Status: AC
  Administered 2014-12-05: 5 10*6.[IU] via INTRAVENOUS
  Filled 2014-12-05: qty 5

## 2014-12-05 MED ORDER — EPHEDRINE 5 MG/ML INJ
10.0000 mg | INTRAVENOUS | Status: DC | PRN
  Filled 2014-12-05: qty 2

## 2014-12-05 MED ORDER — LIDOCAINE HCL (PF) 1 % IJ SOLN
INTRAMUSCULAR | Status: DC | PRN
  Administered 2014-12-05 (×2): 3 mL

## 2014-12-05 MED ORDER — OXYTOCIN 40 UNITS IN LACTATED RINGERS INFUSION - SIMPLE MED
1.0000 m[IU]/min | INTRAVENOUS | Status: DC
  Filled 2014-12-05: qty 1000

## 2014-12-05 MED ORDER — FENTANYL 2.5 MCG/ML BUPIVACAINE 1/10 % EPIDURAL INFUSION (WH - ANES)
INTRAMUSCULAR | Status: AC
  Administered 2014-12-05: 12 mL/h via EPIDURAL
  Filled 2014-12-05: qty 125

## 2014-12-05 MED ORDER — FENTANYL 2.5 MCG/ML BUPIVACAINE 1/10 % EPIDURAL INFUSION (WH - ANES)
14.0000 mL/h | INTRAMUSCULAR | Status: DC | PRN
  Administered 2014-12-05: 14 mL/h via EPIDURAL

## 2014-12-05 MED ORDER — PHENYLEPHRINE 40 MCG/ML (10ML) SYRINGE FOR IV PUSH (FOR BLOOD PRESSURE SUPPORT)
80.0000 ug | PREFILLED_SYRINGE | INTRAVENOUS | Status: DC | PRN
  Filled 2014-12-05: qty 2

## 2014-12-05 MED ORDER — DIPHENHYDRAMINE HCL 50 MG/ML IJ SOLN: 12.5000 mg | INTRAMUSCULAR | Status: DC | PRN

## 2014-12-05 MED ORDER — NALBUPHINE HCL 10 MG/ML IJ SOLN
10.0000 mg | INTRAMUSCULAR | Status: DC | PRN
  Administered 2014-12-05 (×2): 10 mg via INTRAVENOUS
  Filled 2014-12-05 (×2): qty 1

## 2014-12-05 MED ORDER — NALBUPHINE HCL 10 MG/ML IJ SOLN
10.0000 mg | INTRAMUSCULAR | Status: DC | PRN
  Administered 2014-12-05 (×2): 10 mg via INTRAMUSCULAR
  Filled 2014-12-05 (×2): qty 1

## 2014-12-05 MED ORDER — TERBUTALINE SULFATE 1 MG/ML IJ SOLN: 0.2500 mg | Freq: Once | INTRAMUSCULAR | Status: AC | PRN

## 2014-12-05 MED ORDER — OXYTOCIN 40 UNITS IN LACTATED RINGERS INFUSION - SIMPLE MED
1.0000 m[IU]/min | INTRAVENOUS | Status: DC
  Administered 2014-12-05: 2 m[IU]/min via INTRAVENOUS

## 2014-12-05 MED ORDER — FENTANYL 2.5 MCG/ML BUPIVACAINE 1/10 % EPIDURAL INFUSION (WH - ANES): 12.0000 mL/h | INTRAMUSCULAR | Status: DC | PRN

## 2014-12-05 MED ORDER — PHENYLEPHRINE 40 MCG/ML (10ML) SYRINGE FOR IV PUSH (FOR BLOOD PRESSURE SUPPORT)
PREFILLED_SYRINGE | INTRAVENOUS | Status: AC
  Filled 2014-12-05: qty 20

## 2014-12-05 MED ORDER — PENICILLIN G POTASSIUM 5000000 UNITS IJ SOLR
2.5000 10*6.[IU] | INTRAVENOUS | Status: DC
  Administered 2014-12-05 – 2014-12-06 (×4): 2.5 10*6.[IU] via INTRAVENOUS
  Filled 2014-12-05 (×7): qty 2.5

## 2014-12-05 MED ORDER — PROMETHAZINE HCL 25 MG/ML IJ SOLN
25.0000 mg | Freq: Four times a day (QID) | INTRAMUSCULAR | Status: DC | PRN
  Administered 2014-12-05: 25 mg via INTRAMUSCULAR
  Filled 2014-12-05: qty 1

## 2014-12-05 NOTE — Progress Notes (Signed)
Labor Progress Note  Anita Johnson is a 19 y.o. G1P0 at 109w1d  admitted for postdates and nonreassurring efm strip.  Went to introduce myself to patient as oncoming team member  S: Patient states she is feeling uncomfortable with contractions but otherwise doing well. Is getting IV pain medications for pain and is not sure if she wants an epidural later. She wants to know if she can eat.  O:  BP 119/77 mmHg  Pulse 71  Temp(Src) 97.8 F (36.6 C) (Oral)  Resp 20  Ht 5\' 1"  (1.549 m)  Wt 191 lb 8 oz (86.864 kg)  BMI 36.20 kg/m2  LMP 02/20/2014 FHT:  FHR: 135 bpm, variability: minimal ,  accelerations:  Present,  decelerations:  Present variable,late UC:   regular, every 2-5 minutes SVE:   Dilation: 2 Effacement (%): 50 Station: -2 Exam by:: L. Clemmons CNM Membranes intact  Cytotec PO x2  Labs: Lab Results  Component Value Date   WBC 9.2 12/04/2014   HGB 12.1 12/04/2014   HCT 35.6* 12/04/2014   MCV 89.9 12/04/2014   PLT 133* 12/04/2014    Assessment / Plan: 19 y.o. G1P0 [redacted]w[redacted]d in early labor Induction of labor due to non-reassuring fetal testing and postterm  Labor: Progressing normally, FB in place at 0823, Continue PO cytotec  Order placed for labor diet Fetal Wellbeing:  Category II Pain Control:  Labor support without medications, pain medications prn Anticipated MOD:  NSVD  Expectant management   Caryl Ada, DO 12/05/2014, 9:18 AM PGY-1, [facility]

## 2014-12-05 NOTE — Progress Notes (Signed)
Labor Progress Note  Anita Johnson is a 19 y.o. G1P0 at [redacted]w[redacted]d  admitted for postdates and nonreassurring efm strip.  S: Patient states she is feeling uncomfortable with contractions but otherwise doing well. Is getting IV pain medications for pain.  O:  BP 120/68 mmHg  Pulse 68  Temp(Src) 98 F (36.7 C) (Oral)  Resp 18  Ht 5\' 1"  (1.549 m)  Wt 191 lb 8 oz (86.864 kg)  BMI 36.20 kg/m2  LMP 02/20/2014 FHT:  FHR: 140 bpm, variability: minimal ,  accelerations:  Present,  decelerations:  Present early; variable UC:   regular, every 2-3 minutes SVE:   Dilation: 5.5 Effacement (%): 80 Station: -1 Exam by:: Dr. Doroteo Glassman Membranes intact  S/p cytotec PO x2 Pitocin @12mu /min  Labs: Lab Results  Component Value Date   WBC 9.2 12/04/2014   HGB 12.1 12/04/2014   HCT 35.6* 12/04/2014   MCV 89.9 12/04/2014   PLT 133* 12/04/2014    Assessment / Plan: 19 y.o. G1P0 [redacted]w[redacted]d in early labor Induction of labor due to non-reassuring fetal testing and postterm  Labor: Progressing on Pitocin, will continue to increase then AROM, s/p FB and cytotec Fetal Wellbeing:  Category II Pain Control:  [place] Anticipated MOD:  NSVD  Expectant management   Caryl Ada, DO 12/05/2014, 3:36 PM PGY-1,  Family Medicine

## 2014-12-05 NOTE — Progress Notes (Signed)
Foley bulb fell out in bathroom.

## 2014-12-05 NOTE — Progress Notes (Signed)
Anita Johnson is a 19 y.o. G1P0 at [redacted]w[redacted]d by admitted for  Postdates and nonreassurring efm strip  Subjective: Not feeling contractions  Objective: BP 119/77 mmHg  Pulse 71  Temp(Src) 97.8 F (36.6 C) (Oral)  Resp 20  Ht 5\' 1"  (1.549 m)  Wt 86.864 kg (191 lb 8 oz)  BMI 36.20 kg/m2  LMP 02/20/2014      FHT: Cat 2   UC:   irregular SVE: 2/50/-2 Labs: Lab Results  Component Value Date   WBC 9.2 12/04/2014   HGB 12.1 12/04/2014   HCT 35.6* 12/04/2014   MCV 89.9 12/04/2014   PLT 133* 12/04/2014    Assessment / Plan:   Labor: Foley Bulb Preeclampsia:   Fetal Wellbeing:  Category II Pain Control:  Labor support without medications I/D:  GBS positive Anticipated MOD:  NSVD  Rayn Enderson Grissett 12/05/2014, 8:27 AM

## 2014-12-05 NOTE — Progress Notes (Signed)
Desires pain medication after she eats breaskfast

## 2014-12-05 NOTE — Anesthesia Preprocedure Evaluation (Signed)
Anesthesia Evaluation  Patient identified by MRN, date of birth, ID band Patient awake    Reviewed: Allergy & Precautions, Patient's Chart, lab work & pertinent test results  Airway Mallampati: III  TM Distance: >3 FB Neck ROM: Full    Dental no notable dental hx. (+) Teeth Intact   Pulmonary neg pulmonary ROS,  breath sounds clear to auscultation  Pulmonary exam normal       Cardiovascular negative cardio ROS Normal cardiovascular examRhythm:Regular Rate:Normal     Neuro/Psych negative neurological ROS  negative psych ROS   GI/Hepatic Neg liver ROS, GERD-  Medicated and Controlled,  Endo/Other  Obesity  Renal/GU negative Renal ROS  negative genitourinary   Musculoskeletal negative musculoskeletal ROS (+)   Abdominal (+) + obese,   Peds  Hematology  (+) anemia , Thrombocytopenia- mild   Anesthesia Other Findings Pierced tongue and nose  Reproductive/Obstetrics (+) Pregnancy                             Anesthesia Physical Anesthesia Plan  ASA: II  Anesthesia Plan: Epidural   Post-op Pain Management:    Induction:   Airway Management Planned: Natural Airway  Additional Equipment:   Intra-op Plan:   Post-operative Plan:   Informed Consent: I have reviewed the patients History and Physical, chart, labs and discussed the procedure including the risks, benefits and alternatives for the proposed anesthesia with the patient or authorized representative who has indicated his/her understanding and acceptance.     Plan Discussed with: Anesthesiologist  Anesthesia Plan Comments:         Anesthesia Quick Evaluation

## 2014-12-05 NOTE — Anesthesia Procedure Notes (Addendum)
Epidural Patient location during procedure: OB Start time: 12/05/2014 7:30 PM  Staffing Anesthesiologist: Mal Amabile Performed by: anesthesiologist   Preanesthetic Checklist Completed: patient identified, site marked, surgical consent, pre-op evaluation, timeout performed, IV checked, risks and benefits discussed and monitors and equipment checked  Epidural Patient position: sitting Prep: site prepped and draped and DuraPrep Patient monitoring: continuous pulse ox and blood pressure Approach: midline Location: L3-L4 Injection technique: LOR air  Needle:  Needle type: Tuohy  Needle gauge: 17 G Needle length: 9 cm and 9 Needle insertion depth: 7 cm Catheter type: closed end flexible Catheter size: 19 Gauge Catheter at skin depth: 12 cm Test dose: negative and Other  Assessment Events: blood not aspirated, injection not painful, no injection resistance, negative IV test and no paresthesia  Additional Notes Patient identified. Risks and benefits discussed including failed block, incomplete  Pain control, post dural puncture headache, nerve damage, paralysis, blood pressure Changes, nausea, vomiting, reactions to medications-both toxic and allergic and post Partum back pain. All questions were answered. Patient expressed understanding and wished to proceed. Sterile technique was used throughout procedure. Epidural site was Dressed with sterile barrier dressing. No paresthesias, signs of intravascular injection Or signs of intrathecal spread were encountered.  Patient was more comfortable after the epidural was dosed. Please see RN's note for documentation of vital signs and FHR which are stable.

## 2014-12-05 NOTE — Progress Notes (Signed)
Patient ID: TAZARIA BILLOCK, female   DOB: May 18, 1996, 19 y.o.   MRN: [id number] Labor Progress Note  S: resting comfortably with epidural  O:  BP 127/84 mmHg  Pulse 68  Temp(Src) 99.1 F (37.3 C) (Oral)  Resp 18  Ht 5\' 1"  (1.549 m)  Wt 86.864 kg (191 lb 8 oz)  BMI 36.20 kg/m2  SpO2 96%  LMP 02/20/2014 Cat II with some decreased variability but improved from prior. + accels. Rare variable decelerations  CVE: Dilation: 8 Effacement (%): 90 Cervical Position: Middle Station: -1 Presentation: Vertex Exam by:: jdaley   A&P: 19 y.o. G1P0 [redacted]w[redacted]d admitted for IOL due to post dates.  # IOL. Progressing on pitocin. Continue to monitor # FWB. Cat II, variability improving. Cont to monitor closely # Anesthesia. Epidural in place Anticipate vaginal delivery  Fabio Asa, MD 10:56 PM

## 2014-12-05 NOTE — Progress Notes (Signed)
Patient ID: RAVAE SHOWERS, female   DOB: 1995-07-26, 19 y.o.   MRN: [id number] Labor Progress Note  S:  Resting comfortably Nursing noting increased baseline on FHT  O:  BP 123/76 mmHg  Pulse 90  Temp(Src) 98.9 F (37.2 C) (Oral)  Resp 18  Ht 5\' 1"  (1.549 m)  Wt 86.864 kg (191 lb 8 oz)  BMI 36.20 kg/m2  LMP 02/20/2014 Cat II baseline increased from 150 to 160 with occasional variable, not repeated Toco: irregular contractions q 5 minutes  No maternal fever  A&P: 19 y.o. G1P0 [redacted]w[redacted]d admitted for IOL for post dates # IOL. Due for cervical check and repeat cytotec in 2 hours  # FHT strip reviewed with Illene Bolus, CNM. Will give 500 cc bolus and continue monitoring for Cat II strip  Fabio Asa, MD 12:01 AM

## 2014-12-06 ENCOUNTER — Inpatient Hospital Stay (HOSPITAL_COMMUNITY): Admission: RE | Admit: 2014-12-06 | Payer: No Typology Code available for payment source | Source: Ambulatory Visit

## 2014-12-06 ENCOUNTER — Encounter (HOSPITAL_COMMUNITY): Payer: Self-pay | Admitting: *Deleted

## 2014-12-06 DIAGNOSIS — Z3A41 41 weeks gestation of pregnancy: Secondary | ICD-10-CM

## 2014-12-06 DIAGNOSIS — O480 Post-term pregnancy: Secondary | ICD-10-CM

## 2014-12-06 DIAGNOSIS — O9962 Diseases of the digestive system complicating childbirth: Secondary | ICD-10-CM

## 2014-12-06 DIAGNOSIS — O99214 Obesity complicating childbirth: Secondary | ICD-10-CM

## 2014-12-06 DIAGNOSIS — K219 Gastro-esophageal reflux disease without esophagitis: Secondary | ICD-10-CM

## 2014-12-06 DIAGNOSIS — O0933 Supervision of pregnancy with insufficient antenatal care, third trimester: Secondary | ICD-10-CM

## 2014-12-06 MED ORDER — ONDANSETRON HCL 4 MG/2ML IJ SOLN: 4.0000 mg | INTRAMUSCULAR | Status: DC | PRN

## 2014-12-06 MED ORDER — BENZOCAINE-MENTHOL 20-0.5 % EX AERO
1.0000 "application " | INHALATION_SPRAY | CUTANEOUS | Status: DC | PRN
  Filled 2014-12-06: qty 56

## 2014-12-06 MED ORDER — ONDANSETRON HCL 4 MG PO TABS: 4.0000 mg | ORAL_TABLET | ORAL | Status: DC | PRN

## 2014-12-06 MED ORDER — BISACODYL 10 MG RE SUPP: 10.0000 mg | Freq: Every day | RECTAL | Status: DC | PRN

## 2014-12-06 MED ORDER — ZOLPIDEM TARTRATE 5 MG PO TABS: 5.0000 mg | ORAL_TABLET | Freq: Every evening | ORAL | Status: DC | PRN

## 2014-12-06 MED ORDER — LANOLIN HYDROUS EX OINT: TOPICAL_OINTMENT | CUTANEOUS | Status: DC | PRN

## 2014-12-06 MED ORDER — SODIUM CHLORIDE 0.9 % IV SOLN: 250.0000 mL | INTRAVENOUS | Status: DC | PRN

## 2014-12-06 MED ORDER — SODIUM CHLORIDE 0.9 % IJ SOLN: 3.0000 mL | Freq: Two times a day (BID) | INTRAMUSCULAR | Status: DC

## 2014-12-06 MED ORDER — DIPHENHYDRAMINE HCL 25 MG PO CAPS: 25.0000 mg | ORAL_CAPSULE | Freq: Four times a day (QID) | ORAL | Status: DC | PRN

## 2014-12-06 MED ORDER — FLEET ENEMA 7-19 GM/118ML RE ENEM: 1.0000 | ENEMA | Freq: Every day | RECTAL | Status: DC | PRN

## 2014-12-06 MED ORDER — WITCH HAZEL-GLYCERIN EX PADS: 1.0000 "application " | MEDICATED_PAD | CUTANEOUS | Status: DC | PRN

## 2014-12-06 MED ORDER — SENNOSIDES-DOCUSATE SODIUM 8.6-50 MG PO TABS: 2.0000 | ORAL_TABLET | ORAL | Status: DC

## 2014-12-06 MED ORDER — SIMETHICONE 80 MG PO CHEW: 80.0000 mg | CHEWABLE_TABLET | ORAL | Status: DC | PRN

## 2014-12-06 MED ORDER — PRENATAL MULTIVITAMIN CH: 1.0000 | ORAL_TABLET | Freq: Every day | ORAL | Status: DC

## 2014-12-06 MED ORDER — IBUPROFEN 600 MG PO TABS
600.0000 mg | ORAL_TABLET | Freq: Four times a day (QID) | ORAL | Status: DC
Start: 2014-12-06 — End: 2014-12-07
  Administered 2014-12-06 – 2014-12-07 (×5): 600 mg via ORAL
  Filled 2014-12-06 (×5): qty 1

## 2014-12-06 MED ORDER — PRENATAL MULTIVITAMIN CH
1.0000 | ORAL_TABLET | Freq: Every day | ORAL | Status: DC
  Administered 2014-12-06 – 2014-12-07 (×2): 1 via ORAL
  Filled 2014-12-06 (×2): qty 1

## 2014-12-06 MED ORDER — SENNOSIDES-DOCUSATE SODIUM 8.6-50 MG PO TABS
2.0000 | ORAL_TABLET | ORAL | Status: DC
  Administered 2014-12-07: 2 via ORAL
  Filled 2014-12-06: qty 2

## 2014-12-06 MED ORDER — SODIUM CHLORIDE 0.9 % IJ SOLN: 3.0000 mL | INTRAMUSCULAR | Status: DC | PRN

## 2014-12-06 MED ORDER — IBUPROFEN 600 MG PO TABS: 600.0000 mg | ORAL_TABLET | Freq: Four times a day (QID) | ORAL | Status: DC

## 2014-12-06 MED ORDER — OXYTOCIN 40 UNITS IN LACTATED RINGERS INFUSION - SIMPLE MED: 62.5000 mL/h | INTRAVENOUS | Status: DC | PRN

## 2014-12-06 MED ORDER — ACETAMINOPHEN 325 MG PO TABS: 650.0000 mg | ORAL_TABLET | ORAL | Status: DC | PRN

## 2014-12-06 MED ORDER — BENZOCAINE-MENTHOL 20-0.5 % EX AERO: 1.0000 "application " | INHALATION_SPRAY | CUTANEOUS | Status: DC | PRN

## 2014-12-06 MED ORDER — DIBUCAINE 1 % RE OINT: 1.0000 "application " | TOPICAL_OINTMENT | RECTAL | Status: DC | PRN

## 2014-12-06 MED ORDER — BISACODYL 10 MG RE SUPP
10.0000 mg | Freq: Every day | RECTAL | Status: DC | PRN
Start: 2014-12-06 — End: 2014-12-07

## 2014-12-06 NOTE — Progress Notes (Signed)
Patient ID: Anita Johnson, female   DOB: Jan 17, 1996, 19 y.o.   MRN: [id number] Labor Progress Note  S: Patient complete and +1. Pushing. Pain well controlled with epidural  O:  BP 122/73 mmHg  Pulse 80  Temp(Src) 99.8 F (37.7 C) (Oral)  Resp 20  Ht 5\' 1"  (1.549 m)  Wt 86.864 kg (191 lb 8 oz)  BMI 36.20 kg/m2  SpO2 96%  LMP 02/20/2014 FHT: Cat II. Baseline 155 with good variability. + accels. Rare variables Toco: contractions q2-3 minutes CVE: Dilation: 10 Dilation Complete Date: 12/06/14 Dilation Complete Time: 0335 Effacement (%): 100 Cervical Position: Middle Station: +1 Presentation: Vertex Exam by:: JDaley   A&P: 19 y.o. G1P0 [redacted]w[redacted]d admitted for IOL due to post dates.  # IOL. Complete. Continue pushing.  # Anesthesia. Epidural in place Anticipate vaginal delivery  Fabio Asa, MD 5:08 AM

## 2014-12-06 NOTE — Lactation Note (Signed)
This note was copied from the chart of Anita Travis Wotring. Lactation Consultation Note  Patient Name: Anita Johnson Today's Date: 12/06/2014 Reason for consult: Initial assessment  Baby is 6 hours old and LC assisted mom skin to skin, baby awake and rooting. LC showed mom how to hand express , no colostrum noted. Baby lacthed 1st on the right breast cross Cradle with a wide open mouth , few swallows noted and fed 5 mins and released. Re-latched on the left breast - football  And obtained a deeper latch with increased swallows , and increased with breast compressions. Fed 17 mins. Per mom comfortable with both latches.   LC reviewed basics and recommended to mom prior to latching on the 1st breast - breast massage , hand express,  Latch with breast compressions until swallows , and then intermittent with feeding. Watch for non - nutritive feeding patterns. LC discussed with mom and grand mother - due to baby being > 6 pounds , baby needs to be offered the breast @ least every  3 hours and if she doesn't latch skin to skin.  Mother informed of post-discharge support and given phone number to the lactation department, including services for phone call  assistance; out-patient appointments; and breastfeeding support group. List of other breastfeeding resources in the community given  in the handout. Encouraged mother to call for problems or concerns related to breastfeeding.   Maternal Data Has patient been taught Hand Expression?: Yes Does the patient have breastfeeding experience prior to this delivery?: No  Feeding 1st breast , re-latched the left breast for 17 mins and did better in the football position.  Feeding Type: Breast Fed Length of feed: 5 min  LATCH Score/Interventions Latch: Grasps breast easily, tongue down, lips flanged, rhythmical sucking.  Audible Swallowing: A few with stimulation  Type of Nipple: Everted at rest and after stimulation  Comfort  (Breast/Nipple): Soft / non-tender     Hold (Positioning): Assistance needed to correctly position infant at breast and maintain latch. Intervention(s): Breastfeeding basics reviewed;Support Pillows;Position options;Skin to skin  LATCH Score: 8  Lactation Tools Discussed/Used WIC Program: Yes (per mom [place] , encourged to call )   Consult Status Consult Status: Follow-up Date: 12/07/14 Follow-up type: In-patient    Kathrin Greathouse 12/06/2014, 12:37 PM

## 2014-12-06 NOTE — Anesthesia Postprocedure Evaluation (Signed)
Anesthesia Post Note  Patient: Anita Johnson  Procedure(s) Performed: * No procedures listed *  Anesthesia type: Epidural  Patient location: Mother/Baby  Post pain: Pain level controlled  Post assessment: Post-op Vital signs reviewed  Last Vitals:  Filed Vitals:   12/06/14 1400  BP: 143/75  Pulse: 63  Temp: 36.6 C  Resp: 18    Post vital signs: Reviewed  Level of consciousness:alert  Complications: No apparent anesthesia complications

## 2014-12-06 NOTE — Progress Notes (Signed)
Patient ID: Anita Johnson, female   DOB: 1996/03/27, 19 y.o.   MRN: [id number] Labor Progress Note  S: patient is sleeping comfortably   O:  BP 125/65 mmHg  Pulse 77  Temp(Src) 99.4 F (37.4 C) (Oral)  Resp 18  Ht 5\' 1"  (1.549 m)  Wt 86.864 kg (191 lb 8 oz)  BMI 36.20 kg/m2  SpO2 96%  LMP 02/20/2014 FHT: Cat II. Baseline 155 with good variability. + accels. Some early decelerations and occasional variables. Toco: contractions q2-3 minutes CVE: Dilation: 9 Effacement (%): 100 Cervical Position: Middle Station: -1 Presentation: Vertex Exam by:: jdaley   A&P: 19 y.o. G1P0 [redacted]w[redacted]d admitted for IOL due to post dates.  # IOL. Progressing on pitocin. Anticipate vaginal delivery # FWB. Cat II, variability improved with some early and variable decelerations. Cont to monitor closely # Anesthesia. Epidural in place Anticipate vaginal delivery  Fabio Asa, MD 1:50 AM

## 2014-12-07 MED ORDER — IBUPROFEN 600 MG PO TABS: 600.0000 mg | ORAL_TABLET | Freq: Four times a day (QID) | ORAL | Status: DC | PRN

## 2014-12-07 NOTE — Discharge Summary (Signed)
Obstetric Discharge Summary Reason for Admission: induction of labor  Prenatal Procedures: GC/CT early pregnancy, treated 04/2014. Neg 07/23/14 Intrapartum Procedures: vacuum and GBS prophylaxis Postpartum Procedures: none Complications-Operative and Postpartum: superficial L interior labial laceration   12/06/2014 6:36 AM  "Patient is 19 y.o. G1P0 [redacted]w[redacted]d admitted for induction of labor due to post-dates, hx of normal prenatal care   Delivery Note At 6:22 AM a viable female was delivered via Vaginal, Vacuum (Extractor) (Presentation: OP ). APGAR: 9, 9; weight 5 lb 15.6 oz (2710 g).  Placenta status: intact . Cord: 3 vessel with the following complications: None  Patient was completed dilated and +2 in presentation. Patient pushed with good effort for 2.5 hours with some progression but with decreasing effort and increasing fetal caput. Exam with infant in OP position. Dr. Emelda Fear was called to bedside to evaluate for vacuum assistance. Vacuum was felt to be appropriate and patient consented to use. Kiwi vacuum was applied just anterior to the posterior fontanelle in midline position. With contractions, vacuum pressure increased to appropriate level and gentle traction provided. One pop-off occurred with first contraction; second push. Vacuum reapplied and pressure increased with good movement of fetal head. Vacuum pressure released in between contractions. Gentle traction with vacuum was provided for two additional contractions and fetal head delivered over intact perineum in frank OP presentation. Anterior and posterior shoulders delivered without difficulty. Nuchal cord x 1 was noted and infant delivered through with summersault maneuver. Remainder of infant was extracted from vagina and placed on mother's abdomen with nursing assistance. Meconium noted with delivery of infant. Spontaneous cry was noted. Delayed cord clamping x approximately 1 minute was performed. Placental delivered with in  several minutes of the infant and was examined and noted to be intact. Uterus was massaged and noted to be firm with minimal bleeding. A superficial L interior labial laceration was noted that was repaired with single horizontal mattress stitch. Good hemostasis was noted. Uterus was again massaged and noted to be firm with small amount of bleeding   Anesthesia: Epidural  Episiotomy: None  Lacerations: 1st degree labial  Suture Repair: 3.0 vicryl Est. Blood Loss (mL): 87  Mom to postpartum. Baby to Couplet care / Skin to Skin.  Delivery Supervised by Dr. Emelda Fear.   Fabio Asa 12/06/2014, 6:37 AM          Cosigned by: Tilda Burrow, MD at 12/06/2014 2:20 PM"    Hospital Course:  Active Problems:   Post-dates pregnancy   Anita Johnson is a 19 y.o. G1P1001 s/p vacuum assisted vaginal delivery.  Patient was admitted 12/04/14 for induction of labor due to postdates.  She has postpartum course that was uncomplicated. The pt feels ready to go home and  will be discharged with outpatient follow-up.   Today: No acute events overnight.  Pt denies problems with ambulating, voiding or po intake.  She denies nausea or vomiting.  Pain is well controlled.  She has had flatus. She has not had bowel movement.  Lochia Moderate.  Plan for birth control is  abstinence ("does not date men").  Method of Feeding: Breast.  Physical Exam:  General: alert, cooperative and no distress Lochia: appropriate Uterine Fundus: firm DVT Evaluation: No evidence of DVT seen on physical exam. No cords or calf tenderness. No significant calf/ankle edema.  H/H: Lab Results  Component Value Date/Time   HGB 12.1 12/04/2014 09:18 PM   HCT 35.6* 12/04/2014 09:18 PM    Discharge Diagnoses: Term Pregnancy-delivered and Post-date  pregnancy  Discharge Information: Date: 12/07/2014 Activity: pelvic rest Diet: routine  Medications: PNV and Ibuprofen Breast feeding:  Yes Condition:  stable Instructions: refer to handout Discharge to: home       Medication List    STOP taking these medications        ranitidine 150 MG tablet  Commonly known as:  ZANTAC      TAKE these medications        ibuprofen 600 MG tablet  Commonly known as:  ADVIL,MOTRIN  Take 1 tablet (600 mg total) by mouth every 6 (six) hours as needed.     prenatal multivitamin Tabs tablet  Take 1 tablet by mouth daily at 12 noon.         Jingpeng He Medical Student 12/07/2014,8:04 AM  CNM attestation I have seen and examined this patient.  Anita Johnson is a 19 y.o. G1P1001 s/p VAVD.   Pain is well controlled.  Plan for birth control is abstinence.  Method of Feeding: breast  PE:  BP 123/78 mmHg  Pulse 63  Temp(Src) 98 F (36.7 C) (Oral)  Resp 20  Ht  (1.549 m)  Wt 86.864 kg (191 lb 8 oz)  BMI 36.20 kg/m2  SpO2 99%  LMP 02/20/2014  Breastfeeding? Unknown Heart: RRR Lungs: nl effort Fundus firm   Recent Labs  12/04/14 2118  HGB 12.1  HCT 35.6*     Plan: discharge today - postpartum care discussed - f/u clinic in 4-6 weeks for postpartum visit   Chayanne Filippi, CNM 10:40 AM

## 2014-12-07 NOTE — Discharge Instructions (Signed)

## 2014-12-08 DIAGNOSIS — Z5320 Procedure and treatment not carried out because of patient's decision for unspecified reasons: Secondary | ICD-10-CM | POA: Diagnosis not present

## 2014-12-08 DIAGNOSIS — O9089 Other complications of the puerperium, not elsewhere classified: Secondary | ICD-10-CM | POA: Diagnosis present

## 2014-12-09 ENCOUNTER — Encounter (HOSPITAL_COMMUNITY): Payer: Self-pay | Admitting: *Deleted

## 2014-12-09 ENCOUNTER — Inpatient Hospital Stay (HOSPITAL_BASED_OUTPATIENT_CLINIC_OR_DEPARTMENT_OTHER)
Admission: AD | Admit: 2014-12-09 | Discharge: 2014-12-09 | Disposition: A | Discharge status: Home / Self Care | Payer: Medicaid Other | Source: Ambulatory Visit | Attending: Obstetrics and Gynecology | Admitting: Obstetrics and Gynecology

## 2014-12-09 LAB — COMPREHENSIVE METABOLIC PANEL
ALT: 81 U/L — ABNORMAL HIGH (ref 14–54)
AST: 113 U/L — ABNORMAL HIGH (ref 15–41)
Albumin: 2.8 g/dL — ABNORMAL LOW (ref 3.5–5.0)
Alkaline Phosphatase: 126 U/L (ref 38–126)
Anion gap: 6 (ref 5–15)
BUN: 5 mg/dL — ABNORMAL LOW (ref 6–20)
CO2: 24 mmol/L (ref 22–32)
Calcium: 7.9 mg/dL — ABNORMAL LOW (ref 8.9–10.3)
Chloride: 108 mmol/L (ref 101–111)
Creatinine, Ser: 0.56 mg/dL (ref 0.44–1.00)
GFR calc Af Amer: >60 mL/min (ref >60)
GFR calc non Af Amer: >60 mL/min (ref >60)
Glucose, Bld: 76 mg/dL (ref 65–99)
Potassium: 4.3 mmol/L (ref 3.5–5.1)
Sodium: 138 mmol/L (ref 135–145)
Total Bilirubin: 1 mg/dL (ref 0.3–1.2)
Total Protein: 5.9 g/dL — ABNORMAL LOW (ref 6.5–8.1)

## 2014-12-09 LAB — URINE MICROSCOPIC-ADD ON

## 2014-12-09 LAB — URINALYSIS, ROUTINE W REFLEX MICROSCOPIC
Bilirubin Urine: NEGATIVE
Glucose, UA: NEGATIVE mg/dL
Ketones, ur: NEGATIVE mg/dL
Leukocytes, UA: NEGATIVE
Nitrite: NEGATIVE
PH: 7 (ref 5.0–8.0)
Protein, ur: NEGATIVE mg/dL
SPECIFIC GRAVITY, URINE: 1.015 (ref 1.005–1.030)
UROBILINOGEN UA: 1 mg/dL (ref 0.0–1.0)

## 2014-12-09 LAB — CBC
HCT: 32 % — ABNORMAL LOW (ref 36.0–46.0)
Hemoglobin: 10.8 g/dL — ABNORMAL LOW (ref 12.0–15.0)
MCH: 30.9 pg (ref 26.0–34.0)
MCHC: 33.8 g/dL (ref 30.0–36.0)
MCV: 91.7 fL (ref 78.0–100.0)
Platelets: 145 10*3/uL — ABNORMAL LOW (ref 150–400)
RBC: 3.49 MIL/uL — ABNORMAL LOW (ref 3.87–5.11)
RDW: 13.5 % (ref 11.5–15.5)
WBC: 10.9 10*3/uL — ABNORMAL HIGH (ref 4.0–10.5)

## 2014-12-09 LAB — PROTEIN / CREATININE RATIO, URINE
Creatinine, Urine: 160 mg/dL
Protein Creatinine Ratio: 0.12 mg/mg{creat} (ref 0.00–0.15)
Total Protein, Urine: 19 mg/dL

## 2014-12-09 NOTE — ED Notes (Signed)
Called patient three times for triage, no response from patient, assumed pt not in waiting area.

## 2014-12-09 NOTE — MAU Provider Note (Signed)
  History     CSN: [id number]  Arrival date and time: 12/08/14 2311   First Provider Initiated Contact with Patient 12/09/14 0114      Chief Complaint  Patient presents with  . Foot Swelling   HPI 18 y.o. G1P1001 delivered on 12/06/2014- Presents to the MAU stating that she is concerned about the swelling she is having in her legs. She denies headache, visual disturbances, epigastric pain  Past Medical History  Diagnosis Date  . Chlamydia     Past Surgical History  Procedure Laterality Date  . No past surgeries      Family History  Problem Relation Age of Onset  . Cancer Other   . Asthma Other   . Asthma Mother   . Asthma Brother   . Cancer Maternal Grandmother   . Cancer Paternal Grandmother     History  Substance Use Topics  . Smoking status: Never Smoker   . Smokeless tobacco: Never Used  . Alcohol Use: No    Allergies: No Known Allergies  Prescriptions prior to admission  Medication Sig Dispense Refill Last Dose  . ibuprofen (ADVIL,MOTRIN) 600 MG tablet Take 1 tablet (600 mg total) by mouth every 6 (six) hours as needed. 50 tablet 1   . Prenatal Vit-Fe Fumarate-FA (PRENATAL MULTIVITAMIN) TABS tablet Take 1 tablet by mouth daily at 12 noon.   12/03/2014 at Unknown time    Review of Systems  Constitutional: Negative for fever.  Cardiovascular: Positive for leg swelling.  All other systems reviewed and are negative.  Physical Exam   Blood pressure 122/72, pulse 68, temperature 98.2 F (36.8 C), resp. rate 18, height 5\' 1"  (1.549 m), weight 88.451 kg (195 lb), last menstrual period 02/20/2014, not currently breastfeeding. 12/09/14 0117  --  68  --  --  122/72 mmHg  --  --  --  -- KW     12/09/14 0101  --  68  --  --   129/105 mmHg  --  --  --  -- KW    12/09/14 0046  --  69  --  --  131/67 mmHg  --  --  --  -- KW    12/09/14 0035  --  66  --  18  135/81 mmHg  --  --  --  -- KW    12/09/14 0019  98.2 F (36.8 C)  67  --  18  141/71 mmHg  --  --  --   88.451 kg (195 lb) JB        Physical Exam  Nursing note and vitals reviewed. Constitutional: She is oriented to person, place, and time. She appears well-developed and well-nourished. No distress.  HENT:  Head: Normocephalic.  Neck: Normal range of motion.  Cardiovascular: Normal rate.   Respiratory: Effort normal. No respiratory distress.  GI: Soft.  Musculoskeletal: Normal range of motion.  Neurological: She is alert and oriented to person, place, and time.  Skin: Skin is warm and dry.  Psychiatric: She has a normal mood and affect. Her behavior is normal. Judgment and thought content normal.    MAU Course  Procedures  MDM Pre E labs; if nml will discharge to home  Assessment and Plan  Bilateral Edema lower extremities wnl postpartum course  Discharge to home  [facility]ett 12/09/2014, 1:52 AM

## 2014-12-09 NOTE — MAU Note (Addendum)
SVD on Thurs 6/16. Feet swollen some then but have gotten worse and moving up legs. Denies any problems with b/p during pregnancy. Denies headaches, visual changes, n/v. Went to [employer] tonight but left before being seen and came to [facility]

## 2017-04-28 ENCOUNTER — Emergency Department (HOSPITAL_BASED_OUTPATIENT_CLINIC_OR_DEPARTMENT_OTHER)
Admission: EM | Admit: 2017-04-28 | Discharge: 2017-04-28 | Disposition: A | Discharge status: Home / Self Care | Payer: Medicaid Other | Attending: Emergency Medicine | Admitting: Emergency Medicine

## 2017-04-28 ENCOUNTER — Emergency Department (HOSPITAL_BASED_OUTPATIENT_CLINIC_OR_DEPARTMENT_OTHER): Payer: Medicaid Other

## 2017-04-28 ENCOUNTER — Encounter (HOSPITAL_BASED_OUTPATIENT_CLINIC_OR_DEPARTMENT_OTHER): Payer: Self-pay | Admitting: *Deleted

## 2017-04-28 DIAGNOSIS — R0789 Other chest pain: Secondary | ICD-10-CM | POA: Diagnosis not present

## 2017-04-28 DIAGNOSIS — R079 Chest pain, unspecified: Secondary | ICD-10-CM | POA: Diagnosis present

## 2017-04-28 LAB — BASIC METABOLIC PANEL
Anion gap: 6 (ref 5–15)
BUN: 11 mg/dL (ref 6–20)
CALCIUM: 9.4 mg/dL (ref 8.9–10.3)
CO2: 25 mmol/L (ref 22–32)
CREATININE: 0.75 mg/dL (ref 0.44–1.00)
Chloride: 106 mmol/L (ref 101–111)
GFR calc Af Amer: >60 mL/min (ref >60)
GFR calc non Af Amer: >60 mL/min (ref >60)
GLUCOSE: 77 mg/dL (ref 65–99)
Potassium: 3.5 mmol/L (ref 3.5–5.1)
Sodium: 137 mmol/L (ref 135–145)

## 2017-04-28 LAB — CBC
HCT: 39.9 % (ref 36.0–46.0)
Hemoglobin: 13.4 g/dL (ref 12.0–15.0)
MCH: 30.7 pg (ref 26.0–34.0)
MCHC: 33.6 g/dL (ref 30.0–36.0)
MCV: 91.3 fL (ref 78.0–100.0)
PLATELETS: 287 10*3/uL (ref 150–400)
RBC: 4.37 MIL/uL (ref 3.87–5.11)
RDW: 11.8 % (ref 11.5–15.5)
WBC: 8.2 10*3/uL (ref 4.0–10.5)

## 2017-04-28 LAB — TROPONIN I

## 2017-04-28 MED ORDER — GI COCKTAIL ~~LOC~~
30.0000 mL | Freq: Once | ORAL | Status: AC
  Administered 2017-04-28: 30 mL via ORAL
  Filled 2017-04-28: qty 30

## 2017-04-28 MED ORDER — OMEPRAZOLE 20 MG PO CPDR: 20.0000 mg | DELAYED_RELEASE_CAPSULE | Freq: Every day | ORAL | 0 refills | Status: DC

## 2017-04-28 MED ORDER — KETOROLAC TROMETHAMINE 30 MG/ML IJ SOLN
15.0000 mg | Freq: Once | INTRAMUSCULAR | Status: AC
  Administered 2017-04-28: 15 mg via INTRAVENOUS
  Filled 2017-04-28: qty 1

## 2017-04-28 MED ORDER — RANITIDINE HCL 150 MG PO CAPS: 150.0000 mg | ORAL_CAPSULE | Freq: Every day | ORAL | 0 refills | Status: DC

## 2017-04-28 NOTE — ED Triage Notes (Signed)
C/o CP x 2 weeks-NAD-steady gait 

## 2017-04-28 NOTE — ED Notes (Signed)
Pt on monitor 

## 2017-04-28 NOTE — Discharge Instructions (Signed)
Your lab work and imaging has been very reassuring in the ED.  Unknown cause of your chest pain.  May be related to acid reflux.  Please take the Prilosec and Zantac as prescribed once a day.  Follow-up with your primary care doctor for further referral to cardiology if necessary.  Return to the ED if your chest pain becomes exertional, develop worsening shortness of breath or for any new complaints.

## 2017-04-29 NOTE — ED Provider Notes (Signed)
[facility] EMERGENCY DEPARTMENT Provider Note   CSN: [id number] Arrival date & time: 04/28/17  1859     History   Chief Complaint Chief Complaint  Patient presents with  . Chest Pain    HPI Anita Johnson is a 21 y.o. female.  HPI 21 year old African-American female with no significant past medical history presents to the emergency department today with complaints of substernal chest pain.  Patient states the pain has been intermittent for the past 2 weeks.  The pain does not radiate.  Not associate with diaphoresis, nausea, emesis.  Chest pain is nonexertional.  Not pleuritic in nature.  States that she did have acid reflux after her first pregnancy but she does not currently take any medications and this does not feel the same.  Palpation makes the pain worse.  Reports intermittent shortness of breath with chest pain but denies any at this time.  Patient states that her symptoms have completely resolved at this time.  Nothing makes her symptoms better.  She is not training for symptoms prior to arrival.  Patient denies any cardiac history.  Denies any significant family cardiac history.  Denies any history of DVT/PE, prolonged immobilization, recent hospitalization/surgeries, unilateral leg swelling or calf tenderness, OCP use, tobacco use.  Patient denies any history of same.  Pt denies any fever, chill, ha, vision changes, lightheadedness, dizziness, congestion, neck pain, cough, abd pain, n/v/d, urinary symptoms, change in bowel habits, melena, hematochezia, lower extremity paresthesias.  Past Medical History:  Diagnosis Date  . Chlamydia     Patient Active Problem List   Diagnosis Date Noted  . Post-dates pregnancy 12/04/2014  . [redacted] weeks gestation of pregnancy   . [redacted] weeks gestation of pregnancy   . Post-term pregnancy, 40-42 weeks of gestation   . Encounter for fetal anatomic survey   . [redacted] weeks gestation of pregnancy     Past Surgical History:  Procedure  Laterality Date  . NO PAST SURGERIES      OB History    Gravida Para Term Preterm AB Living   1 1 1     1    SAB TAB Ectopic Multiple Live Births         0 1       Home Medications    Prior to Admission medications   Medication Sig Start Date End Date Taking? Authorizing Provider  omeprazole (PRILOSEC) 20 MG capsule Take 1 capsule (20 mg total) daily by mouth. 04/28/17   Rise MuLeaphart, Carlas Vandyne T, PA-C  ranitidine (ZANTAC) 150 MG capsule Take 1 capsule (150 mg total) daily by mouth. 04/28/17   Rise MuLeaphart, Dilynn Munroe T, PA-C    Family History Family History  Problem Relation Age of Onset  . Cancer Other   . Asthma Other   . Asthma Mother   . Asthma Brother   . Cancer Maternal Grandmother   . Cancer Paternal Grandmother     Social History Social History   Tobacco Use  . Smoking status: Never Smoker  . Smokeless tobacco: Never Used  Substance Use Topics  . Alcohol use: No  . Drug use: No     Allergies   Patient has no known allergies.   Review of Systems Review of Systems  Constitutional: Negative for chills, diaphoresis and fever.  HENT: Negative for congestion.   Eyes: Negative for visual disturbance.  Respiratory: Positive for shortness of breath. Negative for cough and wheezing.   Cardiovascular: Positive for chest pain. Negative for palpitations and leg swelling.  Gastrointestinal: Negative for abdominal pain, diarrhea, nausea and vomiting.  Genitourinary: Negative for dysuria, flank pain, frequency, hematuria and urgency.  Musculoskeletal: Negative for arthralgias and myalgias.  Skin: Negative for rash.  Neurological: Negative for dizziness, syncope, weakness, light-headedness, numbness and headaches.  Psychiatric/Behavioral: Negative for sleep disturbance. The patient is not nervous/anxious.      Physical Exam Updated Vital Signs BP 119/80   Pulse (!) 58   Temp 98.5 F (36.9 C) (Oral)   Resp 15   Ht 5' (1.524 m)   Wt 81.6 kg (180 lb)   LMP 04/14/2017    SpO2 99%   BMI 35.15 kg/m   Physical Exam  Constitutional: She is oriented to person, place, and time. She appears well-developed and well-nourished.  Non-toxic appearance. No distress.  HENT:  Head: Normocephalic and atraumatic.  Nose: Nose normal.  Mouth/Throat: Oropharynx is clear and moist.  Eyes: Conjunctivae are normal. Pupils are equal, round, and reactive to light. Right eye exhibits no discharge. Left eye exhibits no discharge.  Neck: Normal range of motion. Neck supple. No JVD present. No tracheal deviation present.  Cardiovascular: Normal rate, regular rhythm, normal heart sounds and intact distal pulses. Exam reveals no gallop and no friction rub.  No murmur heard. Pulmonary/Chest: Effort normal and breath sounds normal. No stridor. No respiratory distress. She has no wheezes. She has no rales. She exhibits tenderness.  No hypoxia or tachypnea.    Abdominal: Soft. Bowel sounds are normal. She exhibits no distension. There is no tenderness. There is no rigidity, no rebound, no guarding, no CVA tenderness, no tenderness at McBurney's point and negative Murphy's sign.  Musculoskeletal: Normal range of motion.  No lower extremity edema or calf tenderness.  Lymphadenopathy:    She has no cervical adenopathy.  Neurological: She is alert and oriented to person, place, and time.  Skin: Skin is warm and dry. Capillary refill takes less than 2 seconds. She is not diaphoretic.  Psychiatric: Her behavior is normal. Judgment and thought content normal.  Nursing note and vitals reviewed.    ED Treatments / Results  Labs (all labs ordered are listed, but only abnormal results are displayed) Labs Reviewed  BASIC METABOLIC PANEL  CBC  TROPONIN I    EKG  EKG Interpretation None       Radiology Dg Chest 2 View  Result Date: 04/28/2017 CLINICAL DATA:  Dyspnea, dry cough and left-sided chest pain x2 weeks. EXAM: CHEST  2 VIEW COMPARISON:  12/27/2011 FINDINGS: The heart size  and mediastinal contours are within normal limits. Both lungs are clear. The visualized skeletal structures are unremarkable. IMPRESSION: No active cardiopulmonary disease. Electronically Signed   By: Tollie Eth M.D.   On: 04/28/2017 19:45    Procedures Procedures (including critical care time)  Medications Ordered in ED Medications  gi cocktail (Maalox,Lidocaine,Donnatal) (30 mLs Oral Given 04/28/17 2202)  ketorolac (TORADOL) 30 MG/ML injection 15 mg (15 mg Intravenous Given 04/28/17 2203)     Initial Impression / Assessment and Plan / ED Course  I have reviewed the triage vital signs and the nursing notes.  Pertinent labs & imaging results that were available during my care of the patient were reviewed by me and considered in my medical decision making (see chart for details).     Pt presents to the Ed today with complaints of cp. Patient is to be discharged with recommendation to follow up with PCP in regards to today's hospital visit. Chest pain is not likely of cardiac or  pulmonary etiology d/t presentation, perc negative, VSS, no tracheal deviation, no JVD or new murmur, RRR, breath sounds equal bilaterally, EKG shows no signs of ischemia, heart pathway score is 1, negative troponin given length of symptoms did not feel that delta troponin is indicated, and negative CXR. Pt has been advised to return to the ED is CP becomes exertional, associated with diaphoresis or nausea, radiates to left jaw/arm, worsens or becomes concerning in any way. Pt appears reliable for follow up and is agreeable to discharge.   Pt is hemodynamically stable, in NAD, & able to ambulate in the ED. Evaluation does not show pathology that would require ongoing emergent intervention or inpatient treatment. I explained the diagnosis to the patient. Pain has been managed & has no complaints prior to dc. Pt is comfortable with above plan and is stable for discharge at this time. All questions were answered prior to  disposition. Strict return precautions for f/u to the ED were discussed. Encouraged follow up with PCP.       Final Clinical Impressions(s) / ED Diagnoses   Final diagnoses:  Atypical chest pain    ED Discharge Orders        Ordered    omeprazole (PRILOSEC) 20 MG capsule  Daily     04/28/17 2222    ranitidine (ZANTAC) 150 MG capsule  Daily     04/28/17 2222       Rise MuLeaphart, Karlei Waldo T, PA-C 04/29/17 0143    Melene PlanFloyd, Dan, DO 04/29/17 1651

## 2018-01-04 ENCOUNTER — Encounter (HOSPITAL_BASED_OUTPATIENT_CLINIC_OR_DEPARTMENT_OTHER): Payer: Self-pay | Admitting: Emergency Medicine

## 2018-01-04 ENCOUNTER — Other Ambulatory Visit: Payer: Self-pay

## 2018-01-04 ENCOUNTER — Emergency Department (HOSPITAL_BASED_OUTPATIENT_CLINIC_OR_DEPARTMENT_OTHER)
Admission: EM | Admit: 2018-01-04 | Discharge: 2018-01-04 | Disposition: A | Discharge status: Home / Self Care | Payer: Medicaid Other | Attending: Emergency Medicine | Admitting: Emergency Medicine

## 2018-01-04 DIAGNOSIS — J029 Acute pharyngitis, unspecified: Secondary | ICD-10-CM

## 2018-01-04 DIAGNOSIS — B9789 Other viral agents as the cause of diseases classified elsewhere: Secondary | ICD-10-CM | POA: Insufficient documentation

## 2018-01-04 DIAGNOSIS — J028 Acute pharyngitis due to other specified organisms: Secondary | ICD-10-CM | POA: Insufficient documentation

## 2018-01-04 LAB — RAPID STREP SCREEN (MED CTR MEBANE ONLY): STREPTOCOCCUS, GROUP A SCREEN (DIRECT): NEGATIVE

## 2018-01-04 NOTE — ED Provider Notes (Signed)
[facility] EMERGENCY DEPARTMENT Provider Note   CSN: [id number] Arrival date & time: 01/04/18  1702     History   Chief Complaint Chief Complaint  Patient presents with  . Sore Throat    HPI Anita Johnson is a 22 y.o. female.  The history is provided by the patient and medical records. No language interpreter was used.  Sore Throat  This is a new problem. The current episode started yesterday. The problem occurs constantly. The problem has not changed since onset.Pertinent negatives include no chest pain, no abdominal pain, no headaches and no shortness of breath. The symptoms are aggravated by swallowing. Nothing relieves the symptoms. She has tried nothing for the symptoms. The treatment provided no relief.    Past Medical History:  Diagnosis Date  . Chlamydia     Patient Active Problem List   Diagnosis Date Noted  . Post-dates pregnancy 12/04/2014  . [redacted] weeks gestation of pregnancy   . [redacted] weeks gestation of pregnancy   . Post-term pregnancy, 40-42 weeks of gestation   . Encounter for fetal anatomic survey   . [redacted] weeks gestation of pregnancy     Past Surgical History:  Procedure Laterality Date  . NO PAST SURGERIES       OB History    Gravida  1   Para  1   Term  1   Preterm      AB      Living  1     SAB      TAB      Ectopic      Multiple  0   Live Births  1            Home Medications    Prior to Admission medications   Medication Sig Start Date End Date Taking? Authorizing Provider  omeprazole (PRILOSEC) 20 MG capsule Take 1 capsule (20 mg total) daily by mouth. 04/28/17   Rise Mu, PA-C  ranitidine (ZANTAC) 150 MG capsule Take 1 capsule (150 mg total) daily by mouth. 04/28/17   Rise Mu, PA-C    Family History Family History  Problem Relation Age of Onset  . Cancer Other   . Asthma Other   . Asthma Mother   . Asthma Brother   . Cancer Maternal Grandmother   . Cancer Paternal  Grandmother     Social History Social History   Tobacco Use  . Smoking status: Never Smoker  . Smokeless tobacco: Never Used  Substance Use Topics  . Alcohol use: No  . Drug use: No     Allergies   Patient has no known allergies.   Review of Systems Review of Systems  Constitutional: Negative for chills, diaphoresis, fatigue and fever.  HENT: Positive for congestion and sore throat. Negative for ear pain, facial swelling, mouth sores, postnasal drip, tinnitus, trouble swallowing and voice change.   Eyes: Negative for visual disturbance.  Respiratory: Negative for cough, chest tightness, shortness of breath and wheezing.   Cardiovascular: Negative for chest pain, palpitations and leg swelling.  Gastrointestinal: Negative for abdominal pain, constipation, diarrhea, nausea and vomiting.  Genitourinary: Negative for dysuria.  Musculoskeletal: Negative for back pain, neck pain and neck stiffness.  Neurological: Negative for light-headedness and headaches.  Psychiatric/Behavioral: Negative for agitation.  All other systems reviewed and are negative.    Physical Exam Updated Vital Signs BP 113/72 (BP Location: Right Arm)   Pulse 74   Temp 98.4 F (36.9 C) (Oral)  Resp 16   Ht 5' (1.524 m)   Wt 79.4 kg (175 lb)   LMP 12/05/2017   SpO2 99%   BMI 34.18 kg/m   Physical Exam  Constitutional: She appears well-developed and well-nourished.  Non-toxic appearance. She does not appear ill. No distress.  HENT:  Head: Normocephalic and atraumatic.  Right Ear: External ear normal.  Left Ear: External ear normal.  Nose: Nose normal.  Mouth/Throat: Posterior oropharyngeal erythema present. No oropharyngeal exudate, posterior oropharyngeal edema or tonsillar abscesses.  Eyes: Pupils are equal, round, and reactive to light. Conjunctivae and EOM are normal. Right eye exhibits no discharge. Left eye exhibits no discharge.  Neck: Normal range of motion. Neck supple. No tracheal  deviation present.  Cardiovascular: Normal rate and regular rhythm.  No murmur heard. Pulmonary/Chest: Effort normal and breath sounds normal. No respiratory distress. She has no wheezes. She has no rales. She exhibits no tenderness.  Abdominal: Soft. There is no tenderness.  Musculoskeletal: She exhibits no edema or tenderness.  Lymphadenopathy:    She has no cervical adenopathy.  Neurological: She is alert.  Skin: Skin is warm and dry. Capillary refill takes less than 2 seconds. No rash noted. No erythema.  Psychiatric: She has a normal mood and affect.  Nursing note and vitals reviewed.    ED Treatments / Results  Labs (all labs ordered are listed, but only abnormal results are displayed) Labs Reviewed  RAPID STREP SCREEN ([facility] ONLY)  CULTURE, GROUP A STREP [facility])    EKG None  Radiology No results found.  Procedures Procedures (including critical care time)  Medications Ordered in ED Medications - No data to display   Initial Impression / Assessment and Plan / ED Course  I have reviewed the triage vital signs and the nursing notes.  Pertinent labs & imaging results that were available during my care of the patient were reviewed by me and considered in my medical decision making (see chart for details).     Anita Johnson is a 22 y.o. female with no significant past medical history who presents with sore throat.  Patient reports that her symptoms began yesterday and have continued today.  She reports she still able to swallow but is painful.  She denies difficulty breathing.  She reports she has had multiple episodes of strep throat in the past and thinks feels similar.  She denies fevers or chills but does report some mild congestion.  She reports her daughter recently had a URI.  She denies neck pain or neck stiffness.  She denies any cough, congestion, or shortness of breath.  She denies any traumatic injuries or any other complaints. She describes her pain as  moderate.  On exam, patient had mild erythema in her posterior oropharynx.  No evidence of peritonsillar abscess, uvula midline.  Patient is normal range of motion of neck without pain.  No tender lymphadenopathy.  No stridor appreciated on auscultation.  Lungs clear and chest was nontender.  Patient had no trismus and no other abnormality seen on oropharyngeal exam.  Ears showed no evidence of otitis media.  No mastoid tenderness.  Exam otherwise unremarkable.  Clinically I am concerned about viral pharyngitis versus strep pharyngitis.  Patient will have swallow to further evaluate.  6:05 PM Strep test was negative.  Culture was sent.  Suspect viral pharyngitis consistent with the story.    Patient advised on conservative treatment management strategies and will follow with PCP.  Patient understood return precautions.  Doubt  other concerning etiology of symptoms and patient was discharged in good condition.  Final Clinical Impressions(s) / ED Diagnoses   Final diagnoses:  Viral pharyngitis  Sore throat    ED Discharge Orders    None     Clinical Impression: 1. Viral pharyngitis   2. Sore throat     Disposition: Discharge  Condition: Good  I have discussed the results, Dx and Tx plan with the pt(& family if present). He/she/they expressed understanding and agree(s) with the plan. Discharge instructions discussed at great length. Strict return precautions discussed and pt &/or family have verbalized understanding of the instructions. No further questions at time of discharge.    New Prescriptions   No medications on file    [facility] [address] [id number] [facility]c [address] [id number] [phone]       Aileena Iglesia, Canary Brimhristopher J, MD 01/04/18 [phone]

## 2018-01-04 NOTE — ED Triage Notes (Signed)
Sore throat since yesterday.

## 2018-01-04 NOTE — Discharge Instructions (Addendum)
Your exam and work-up today was consistent with a viral pharyngitis causing her symptoms.  Please follow-up with your primary doctor and stay hydrated.  Please return to the nearest emergency department.

## 2018-01-04 NOTE — ED Notes (Signed)
ED Provider at bedside. 

## 2018-01-07 LAB — CULTURE, GROUP A STREP (THRC)

## 2019-05-24 ENCOUNTER — Other Ambulatory Visit: Payer: Self-pay

## 2019-05-24 ENCOUNTER — Encounter (HOSPITAL_COMMUNITY): Payer: Self-pay | Admitting: Emergency Medicine

## 2019-05-24 ENCOUNTER — Ambulatory Visit (HOSPITAL_COMMUNITY)
Admission: EM | Admit: 2019-05-24 | Discharge: 2019-05-24 | Disposition: A | Discharge status: Home / Self Care | Payer: Medicaid Other | Attending: Emergency Medicine | Admitting: Emergency Medicine

## 2019-05-24 DIAGNOSIS — J029 Acute pharyngitis, unspecified: Secondary | ICD-10-CM | POA: Diagnosis not present

## 2019-05-24 MED ORDER — IBUPROFEN 600 MG PO TABS: 600.0000 mg | ORAL_TABLET | Freq: Four times a day (QID) | ORAL | 0 refills | Status: DC | PRN

## 2019-05-24 MED ORDER — SUCRALFATE 1 GM/10ML PO SUSP: 1.0000 g | Freq: Four times a day (QID) | ORAL | 0 refills | Status: AC

## 2019-05-24 NOTE — ED Provider Notes (Signed)
HPI  SUBJECTIVE:  Anita Johnson is a 23 y.o. female who presents with constant right-sided sore throat with radiation to her ear, pain with swallowing, persistent foreign body sensation after getting a [place] fry stuck in her throat 5 days ago.  She denies actually inhaling the [place] fry.  She denies difficulty breathing.  She states that her voice is intermittently raspy.  No wheezing, shortness of breath, chest pain.  No neck stiffness, fevers.  She is able to eat and drink, but with pain.  She states that she is frequently clearing her throat and that her throat feels swollen.  She tried sedatives, sore throat spray without improvement of symptoms.  No alleviating factors.  Symptoms are worse with swallowing.  Past medical history none for diabetes, hypertension.  LMP: 10/29.  Denies the possibility of being pregnant.  PMD: [facility].  Past Medical History:  Diagnosis Date  . Chlamydia     Past Surgical History:  Procedure Laterality Date  . NO PAST SURGERIES      Family History  Problem Relation Age of Onset  . Cancer Other   . Asthma Other   . Asthma Mother   . Asthma Brother   . Cancer Maternal Grandmother   . Cancer Paternal Grandmother     Social History   Tobacco Use  . Smoking status: Never Smoker  . Smokeless tobacco: Never Used  Substance Use Topics  . Alcohol use: No  . Drug use: No    No current facility-administered medications for this encounter.   Current Outpatient Medications:  .  ibuprofen (ADVIL) 600 MG tablet, Take 1 tablet (600 mg total) by mouth every 6 (six) hours as needed., Disp: 30 tablet, Rfl: 0 .  sucralfate (CARAFATE) 1 GM/10ML suspension, Take 10 mLs (1 g total) by mouth 4 (four) times daily., Disp: 240 mL, Rfl: 0  No Known Allergies   ROS  As noted in HPI.   Physical Exam  BP 116/68 (BP Location: Right Arm) Comment (BP Location): large cuff  Pulse 60   Temp 98.4 F (36.9 C) (Oral)   Resp 18   LMP 04/20/2019   SpO2  100%   Constitutional: Well developed, well nourished, no acute distress Eyes:  EOMI, conjunctiva normal bilaterally HENT: Normocephalic, atraumatic,mucus membranes moist.  Voice normal.  No drooling, stridor.  Was able to visualize deep into the oropharynx, no foreign body seen.  Was unable to visualize the epiglottis. Neck: No cervical lymphadenopathy. Respiratory: Normal inspiratory effort lungs clear bilaterally, good air movement Cardiovascular: Normal rate GI: nondistended skin: No rash, skin intact Musculoskeletal: no deformities Neurologic: Alert & oriented x 3, no focal neuro deficits Psychiatric: Speech and behavior appropriate   ED Course   Medications - No data to display  No orders of the defined types were placed in this encounter.   No results found for this or any previous visit (from the past 24 hour(s)). No results found.  ED Clinical Impression  1. Sore throat      ED Assessment/Plan  No foreign body visualized, but discussed with patient that I was not able to visualize down to the area where she was experiencing the pain.  Suspect that she scratched her esophagus.  Doubt retained foreign body since this was organic material.  Will send home with ibuprofen/Tylenol, Benadryl/Maalox mixture 3-4 times a day, Carafate, follow-up with [facility] ear nose [facility] throat if not any better in a day or 2 with this regimen.  She is to go  to the ER if she gets worse.  Discussed  MDM, treatment plan, and plan for follow-up with patient. Discussed sn/sx that should prompt return to the ED. patient agrees with plan.   Meds ordered this encounter  Medications  . ibuprofen (ADVIL) 600 MG tablet    Sig: Take 1 tablet (600 mg total) by mouth every 6 (six) hours as needed.    Dispense:  30 tablet    Refill:  0  . sucralfate (CARAFATE) 1 GM/10ML suspension    Sig: Take 10 mLs (1 g total) by mouth 4 (four) times daily.    Dispense:  240 mL    Refill:  0    *This clinic  note was created using Scientist, clinical (histocompatibility and immunogenetics). Therefore, there may be occasional mistakes despite careful proofreading.   ?    Domenick Gong, MD 05/24/19 [phone]

## 2019-05-24 NOTE — ED Triage Notes (Signed)
On Saturday 05/20/2019, patient choked on a french fry.   Patient reports throat is sore and is frequently clearing her throat since incident.  Patient reports sensation is on the right side of throat

## 2019-05-24 NOTE — Discharge Instructions (Signed)
Take 600 mg ibuprofen combined with 1 g of Tylenol 3-4 times a day as needed for pain.  This is a very effective combination for pain, inflammation.  Try mixing 5 mL of children's liquid Benadryl 5 mL of Maalox together in a glass, holding her mouth for as long as you can, gargle and swallow.  The Benadryl will help with the inflammation the Maalox will coat your throat.  The Carafate will also coat your throat while it heals.  Follow-up with [facility] ear nose and throat in several days if this regimen is not helping.

## 2019-05-26 ENCOUNTER — Emergency Department (HOSPITAL_BASED_OUTPATIENT_CLINIC_OR_DEPARTMENT_OTHER)
Admission: EM | Admit: 2019-05-26 | Discharge: 2019-05-26 | Disposition: A | Discharge status: Home / Self Care | Payer: Medicaid Other | Attending: Emergency Medicine | Admitting: Emergency Medicine

## 2019-05-26 ENCOUNTER — Other Ambulatory Visit: Payer: Self-pay

## 2019-05-26 ENCOUNTER — Encounter (HOSPITAL_BASED_OUTPATIENT_CLINIC_OR_DEPARTMENT_OTHER): Payer: Self-pay

## 2019-05-26 DIAGNOSIS — R0989 Other specified symptoms and signs involving the circulatory and respiratory systems: Secondary | ICD-10-CM | POA: Insufficient documentation

## 2019-05-26 DIAGNOSIS — J029 Acute pharyngitis, unspecified: Secondary | ICD-10-CM | POA: Diagnosis present

## 2019-05-26 DIAGNOSIS — Z5321 Procedure and treatment not carried out due to patient leaving prior to being seen by health care provider: Secondary | ICD-10-CM | POA: Diagnosis not present

## 2019-05-26 NOTE — ED Triage Notes (Signed)
Pt reports being seen at [facility] r/t sore throat from choking on a french fry. Pt reports that she was given medication for this and she feels that it made her sore throat worse. Denies any SOB or rash.

## 2019-06-14 ENCOUNTER — Encounter (HOSPITAL_COMMUNITY): Payer: Self-pay | Admitting: Emergency Medicine

## 2019-06-14 ENCOUNTER — Other Ambulatory Visit: Payer: Self-pay

## 2019-06-14 ENCOUNTER — Ambulatory Visit (HOSPITAL_COMMUNITY)
Admission: EM | Admit: 2019-06-14 | Discharge: 2019-06-14 | Disposition: A | Discharge status: Home / Self Care | Payer: Medicaid Other | Attending: Family Medicine | Admitting: Family Medicine

## 2019-06-14 DIAGNOSIS — R05 Cough: Secondary | ICD-10-CM | POA: Diagnosis not present

## 2019-06-14 DIAGNOSIS — R0789 Other chest pain: Secondary | ICD-10-CM

## 2019-06-14 DIAGNOSIS — R059 Cough, unspecified: Secondary | ICD-10-CM

## 2019-06-14 DIAGNOSIS — J9801 Acute bronchospasm: Secondary | ICD-10-CM

## 2019-06-14 MED ORDER — IBUPROFEN 800 MG PO TABS: 800.0000 mg | ORAL_TABLET | Freq: Three times a day (TID) | ORAL | 0 refills | Status: AC

## 2019-06-14 MED ORDER — HYDROCODONE-HOMATROPINE 5-1.5 MG/5ML PO SYRP: 5.0000 mL | ORAL_SOLUTION | Freq: Four times a day (QID) | ORAL | 0 refills | Status: AC | PRN

## 2019-06-14 MED ORDER — PREDNISONE 10 MG (21) PO TBPK: ORAL_TABLET | Freq: Every day | ORAL | 0 refills | Status: AC

## 2019-06-14 NOTE — ED Triage Notes (Signed)
Pt here for dry cough onset 1 week ... also reports she pulled a muscle on right flank d/t coughing  Denies fevers, SOB/Dyspnea   A&O x4... Ambulatory

## 2019-06-14 NOTE — Discharge Instructions (Signed)
Be aware, the cough medication prescribed may cause drowsiness. Please do not drive, operate heavy machinery or make important decisions while on this medication.

## 2019-06-15 NOTE — ED Provider Notes (Signed)
[place]   [id number] 06/14/19 Arrival Time: 2094  ASSESSMENT & PLAN:  1. Cough   2. Bronchospasm, acute   3. Acute chest wall pain     No indications for chest imaging at this time.  Begin: Meds ordered this encounter  Medications  . HYDROcodone-homatropine (HYCODAN) 5-1.5 MG/5ML syrup    Sig: Take 5 mLs by mouth every 6 (six) hours as needed for cough.    Dispense:  90 mL    Refill:  0  . predniSONE (STERAPRED UNI-PAK 21 TAB) 10 MG (21) TBPK tablet    Sig: Take by mouth daily. Take as directed.    Dispense:  21 tablet    Refill:  0  . ibuprofen (ADVIL) 800 MG tablet    Sig: Take 1 tablet (800 mg total) by mouth 3 (three) times daily with meals.    Dispense:  21 tablet    Refill:  0    Cough medication sedation precautions. Discussed typical duration of symptoms. OTC symptom care as needed. Ensure adequate fluid intake and rest. May f/u with PCP or here as needed.  Reviewed expectations re: course of current medical issues. Questions answered. Outlined signs and symptoms indicating need for more acute intervention. Patient verbalized understanding. After Visit Summary given.   SUBJECTIVE: History from: patient.  Anita Johnson is a 23 y.o. female who presents with complaint of nasal congestion, post-nasal drainage, and a persistent dry cough; without sore throat. Onset abrupt, a week ago; without fatigue and without body aches. SOB: none. Wheezing: questions at times; esp with coughing. Fever: absent. Overall normal PO intake without n/v. Known sick contacts or COVID-19 exposure: no. No specific or significant aggravating or alleviating factors reported. Cough is affecting sleep. OTC treatment: none reported. Also reports R chest wall pain secondary to persistent coughing. Received flu shot this year: no.  Social History   Tobacco Use  Smoking Status Never Smoker  Smokeless Tobacco Never Used    ROS: As per HPI. All other systems negative.     OBJECTIVE:  Vitals:   06/14/19 1853  BP: 128/77  Pulse: 91  Resp: 16  Temp: 98.8 F (37.1 C)  TempSrc: Oral  SpO2: 97%     General appearance: alert; appears fatigued HEENT: nasal congestion; clear runny nose; throat irritation secondary to post-nasal drainage Neck: supple without LAD CV: RRR Lungs: unlabored respirations, symmetrical air entry with mild expiratory wheezing at bases; cough: moderate, dry; speaks full sentences without difficulty Abd: soft Ext: no LE edema Skin: warm and dry Psychological: alert and cooperative; normal mood and affect    No Known Allergies  Past Medical History:  Diagnosis Date  . Chlamydia    Family History  Problem Relation Age of Onset  . Cancer Other   . Asthma Other   . Asthma Mother   . Asthma Brother   . Cancer Maternal Grandmother   . Cancer Paternal Grandmother    Social History   Socioeconomic History  . Marital status: Single    Spouse name: Not on file  . Number of children: Not on file  . Years of education: Not on file  . Highest education level: Not on file  Occupational History  . Not on file  Tobacco Use  . Smoking status: Never Smoker  . Smokeless tobacco: Never Used  Substance and Sexual Activity  . Alcohol use: No  . Drug use: No  . Sexual activity: Not on file  Other Topics Concern  . Not  on file  Social History Narrative  . Not on file   Social Determinants of Health   Financial Resource Strain:   . Difficulty of Paying Living Expenses: Not on file  Food Insecurity:   . Worried About Programme researcher, broadcasting/film/video in the Last Year: Not on file  . Ran Out of Food in the Last Year: Not on file  Transportation Needs:   . Lack of Transportation (Medical): Not on file  . Lack of Transportation (Non-Medical): Not on file  Physical Activity:   . Days of Exercise per Week: Not on file  . Minutes of Exercise per Session: Not on file  Stress:   . Feeling of Stress : Not on file  Social Connections:     . Frequency of Communication with Friends and Family: Not on file  . Frequency of Social Gatherings with Friends and Family: Not on file  . Attends Religious Services: Not on file  . Active Member of Clubs or Organizations: Not on file  . Attends Banker Meetings: Not on file  . Marital Status: Not on file  Intimate Partner Violence:   . Fear of Current or Ex-Partner: Not on file  . Emotionally Abused: Not on file  . Physically Abused: Not on file  . Sexually Abused: Not on file           Mardella Layman, MD 06/15/19 [phone]

## 2019-08-26 IMAGING — CR DG CHEST 2V
2 series · 2 of 2 positions shown · non-contrast
Comparison: 12/27/2011

CLINICAL DATA: Dyspnea, dry cough and left-sided chest pain x2
weeks.

EXAM:
CHEST  2 VIEW

[w chest pa]
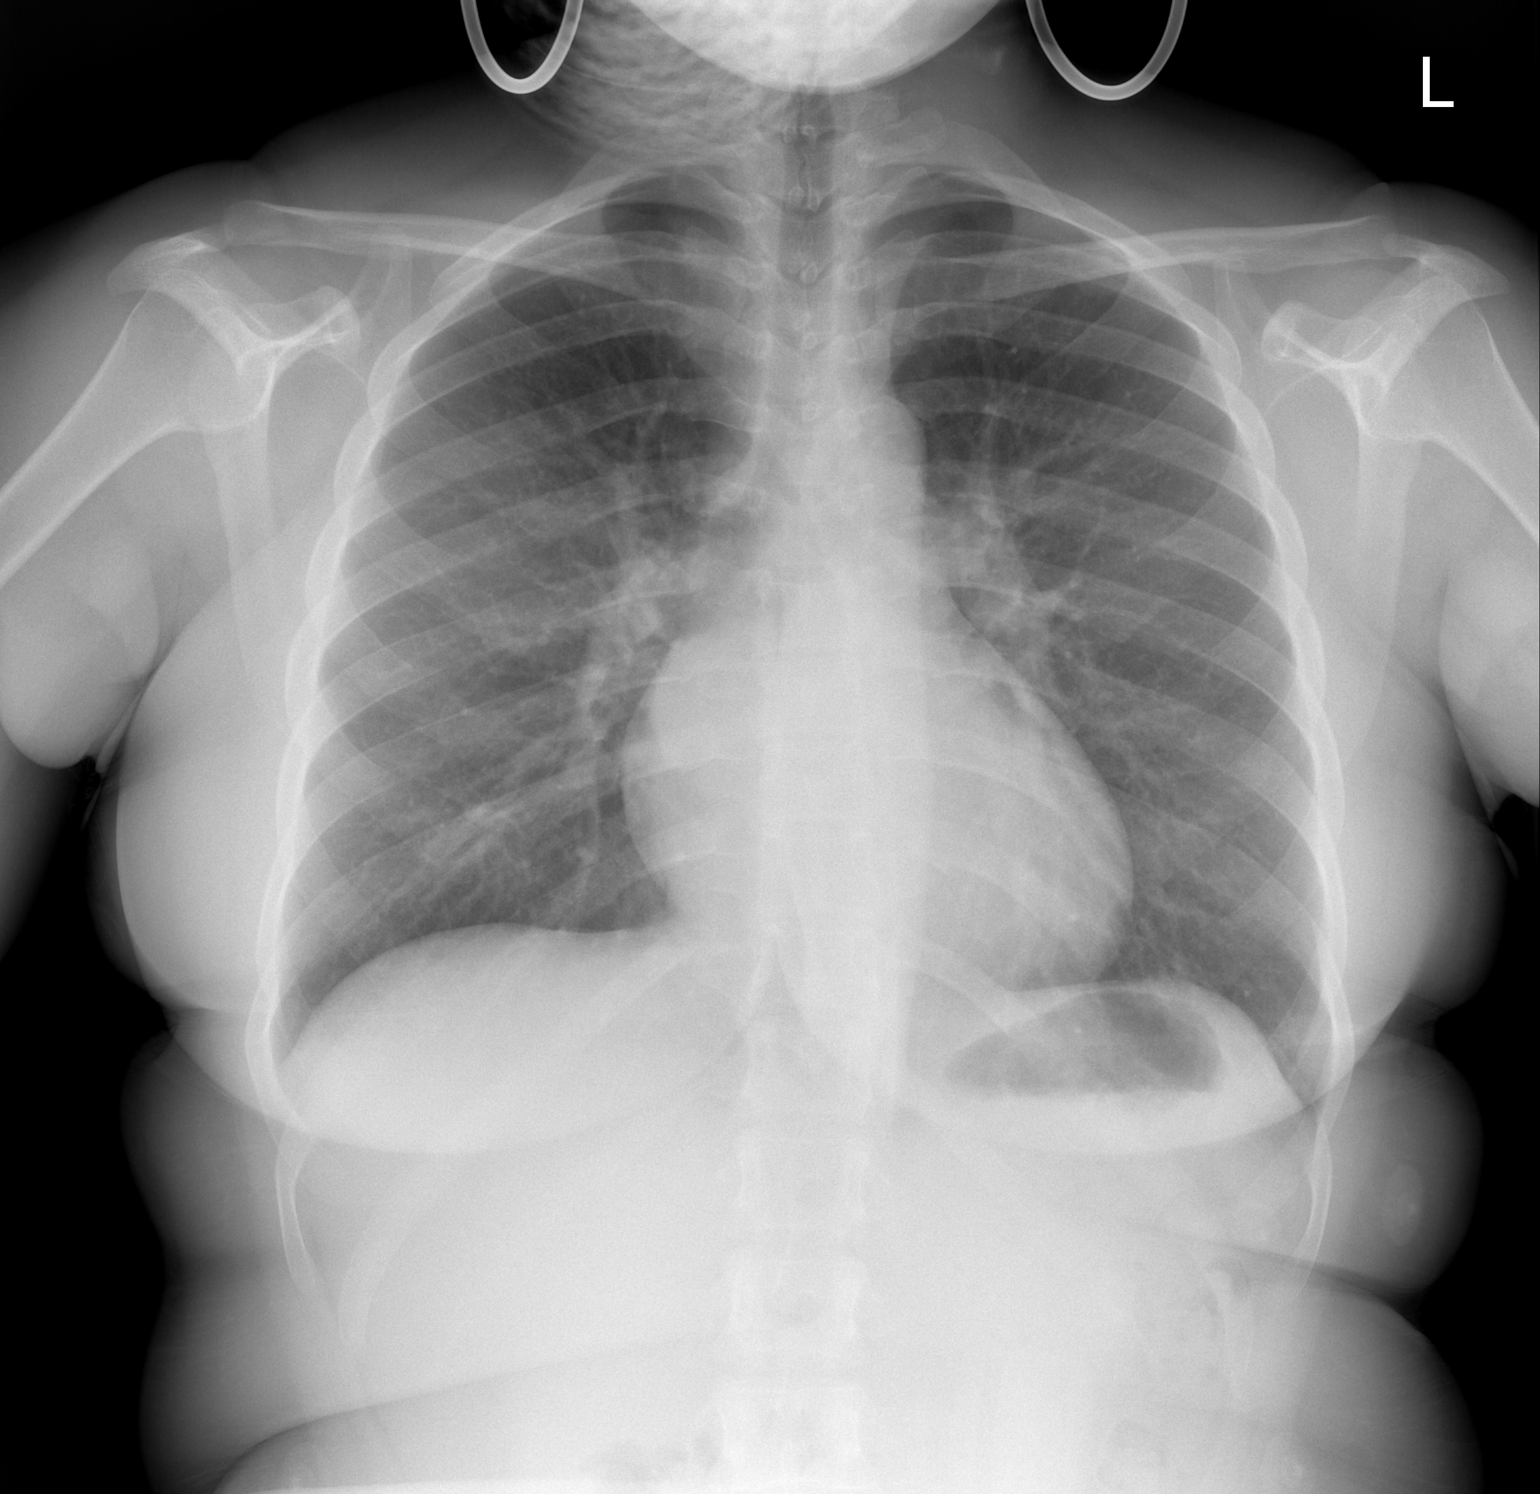

[w chest lat]
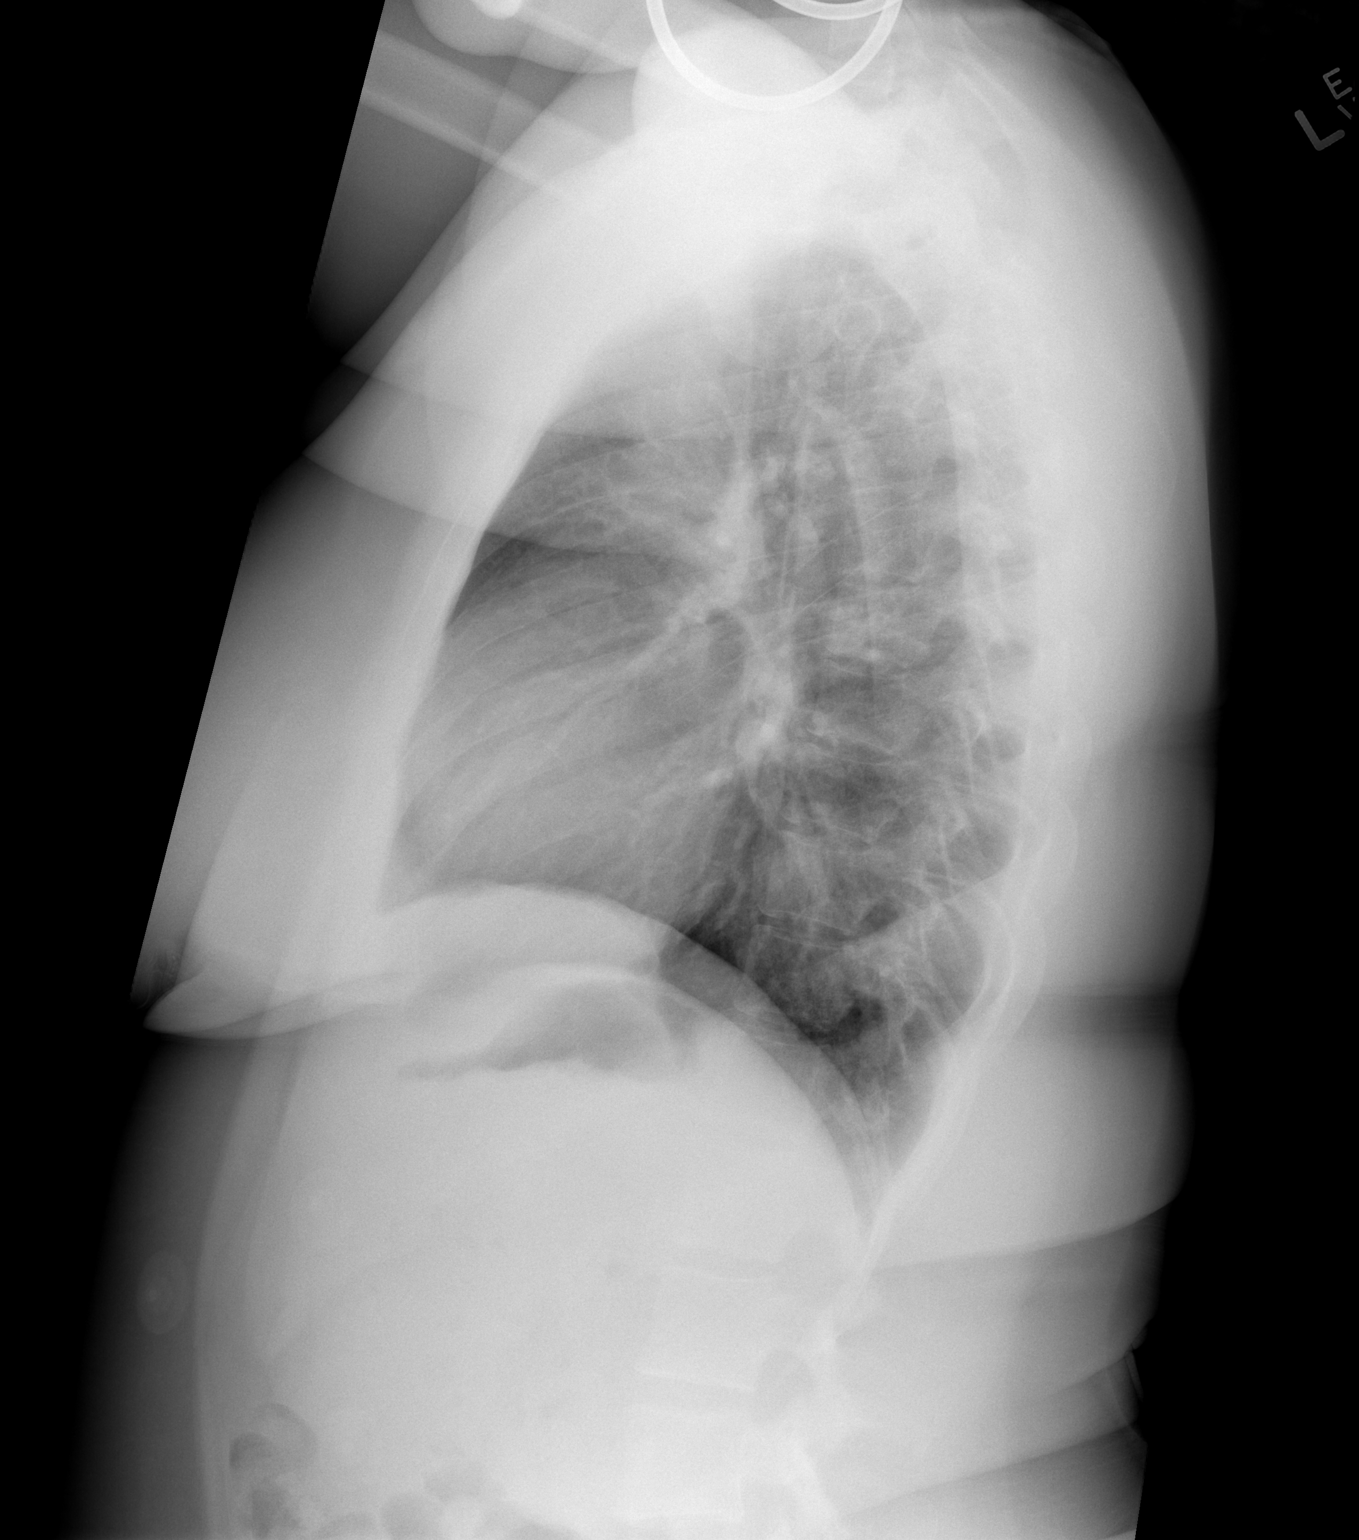

[2 of 2 positions shown; findings below may reference images not displayed]

FINDINGS: The heart size and mediastinal contours are within normal limits.
Both lungs are clear. The visualized skeletal structures are
unremarkable.
IMPRESSION: No active cardiopulmonary disease.
